# Patient Record
Sex: Male | Born: 1970 | Race: Black or African American | Hispanic: No | Smoking: Never smoker
Health system: Southern US, Community
[De-identification: ages and names within clinical notes are randomized; demographics above are authoritative.]

## PROBLEM LIST (undated history)

## (undated) DIAGNOSIS — E1140 Type 2 diabetes mellitus with diabetic neuropathy, unspecified: Secondary | ICD-10-CM

## (undated) DIAGNOSIS — Z2821 Immunization not carried out because of patient refusal: Secondary | ICD-10-CM

## (undated) DIAGNOSIS — E785 Hyperlipidemia, unspecified: Secondary | ICD-10-CM

## (undated) DIAGNOSIS — J9811 Atelectasis: Secondary | ICD-10-CM

## (undated) DIAGNOSIS — Z9229 Personal history of other drug therapy: Secondary | ICD-10-CM

## (undated) DIAGNOSIS — I10 Essential (primary) hypertension: Secondary | ICD-10-CM

## (undated) DIAGNOSIS — R809 Proteinuria, unspecified: Secondary | ICD-10-CM

## (undated) DIAGNOSIS — N529 Male erectile dysfunction, unspecified: Secondary | ICD-10-CM

## (undated) HISTORY — PX: HERNIA REPAIR: SHX51

## (undated) HISTORY — DX: Personal history of other drug therapy: Z92.29

## (undated) HISTORY — PX: CHOLECYSTECTOMY: SHX55

## (undated) HISTORY — DX: Type 2 diabetes mellitus with diabetic neuropathy, unspecified: E11.40

## (undated) HISTORY — DX: Hyperlipidemia, unspecified: E78.5

## (undated) HISTORY — DX: Proteinuria, unspecified: R80.9

## (undated) HISTORY — DX: Male erectile dysfunction, unspecified: N52.9

## (undated) HISTORY — DX: Atelectasis: J98.11

## (undated) HISTORY — DX: Essential (primary) hypertension: I10

## (undated) HISTORY — PX: WISDOM TOOTH EXTRACTION: SHX21

## (undated) HISTORY — DX: Immunization not carried out because of patient refusal: Z28.21

---

## 2005-08-19 ENCOUNTER — Encounter: Admission: RE | Admit: 2005-08-19 | Discharge: 2005-08-19 | Payer: Self-pay | Admitting: Unknown Physician Specialty

## 2006-05-15 ENCOUNTER — Emergency Department (HOSPITAL_COMMUNITY): Admission: EM | Admit: 2006-05-15 | Discharge: 2006-05-15 | Payer: Self-pay | Admitting: Emergency Medicine

## 2006-11-11 ENCOUNTER — Ambulatory Visit: Payer: Self-pay | Admitting: Family Medicine

## 2006-11-17 ENCOUNTER — Ambulatory Visit: Payer: Self-pay | Admitting: Family Medicine

## 2006-11-20 ENCOUNTER — Ambulatory Visit: Payer: Self-pay | Admitting: Family Medicine

## 2006-11-30 ENCOUNTER — Encounter: Admission: RE | Admit: 2006-11-30 | Discharge: 2006-11-30 | Payer: Self-pay | Admitting: Family Medicine

## 2006-12-21 ENCOUNTER — Ambulatory Visit: Payer: Self-pay | Admitting: Family Medicine

## 2007-01-21 ENCOUNTER — Ambulatory Visit: Payer: Self-pay | Admitting: Family Medicine

## 2007-04-28 ENCOUNTER — Ambulatory Visit: Payer: Self-pay | Admitting: Family Medicine

## 2007-07-26 ENCOUNTER — Ambulatory Visit: Payer: Self-pay | Admitting: Family Medicine

## 2007-11-29 ENCOUNTER — Ambulatory Visit: Payer: Self-pay | Admitting: Family Medicine

## 2007-12-15 ENCOUNTER — Ambulatory Visit: Payer: Self-pay | Admitting: Family Medicine

## 2007-12-18 ENCOUNTER — Emergency Department (HOSPITAL_COMMUNITY): Admission: EM | Admit: 2007-12-18 | Discharge: 2007-12-18 | Payer: Self-pay | Admitting: Advanced Practice Midwife

## 2009-02-01 ENCOUNTER — Ambulatory Visit: Payer: Self-pay | Admitting: Family Medicine

## 2009-02-15 ENCOUNTER — Ambulatory Visit: Payer: Self-pay | Admitting: Family Medicine

## 2009-05-17 ENCOUNTER — Ambulatory Visit: Payer: Self-pay | Admitting: Family Medicine

## 2009-09-06 ENCOUNTER — Ambulatory Visit: Payer: Self-pay | Admitting: Family Medicine

## 2009-12-05 ENCOUNTER — Ambulatory Visit: Payer: Self-pay | Admitting: Family Medicine

## 2010-04-04 ENCOUNTER — Ambulatory Visit: Payer: Self-pay | Admitting: Family Medicine

## 2010-04-16 ENCOUNTER — Ambulatory Visit (INDEPENDENT_AMBULATORY_CARE_PROVIDER_SITE_OTHER): Payer: BC Managed Care – PPO | Admitting: Family Medicine

## 2010-04-16 DIAGNOSIS — I10 Essential (primary) hypertension: Secondary | ICD-10-CM

## 2010-04-16 DIAGNOSIS — E785 Hyperlipidemia, unspecified: Secondary | ICD-10-CM

## 2010-04-16 DIAGNOSIS — Z79899 Other long term (current) drug therapy: Secondary | ICD-10-CM

## 2010-04-16 DIAGNOSIS — R040 Epistaxis: Secondary | ICD-10-CM

## 2010-04-16 DIAGNOSIS — E119 Type 2 diabetes mellitus without complications: Secondary | ICD-10-CM

## 2010-05-30 ENCOUNTER — Telehealth: Payer: Self-pay | Admitting: Family Medicine

## 2010-05-30 MED ORDER — VARDENAFIL HCL 20 MG PO TABS: 20.0000 mg | ORAL_TABLET | ORAL | Status: DC | PRN

## 2010-05-30 NOTE — Telephone Encounter (Signed)
Meds renewed.

## 2010-07-10 ENCOUNTER — Encounter: Payer: Self-pay | Admitting: Family Medicine

## 2010-08-14 ENCOUNTER — Encounter: Payer: Self-pay | Admitting: Family Medicine

## 2010-08-16 ENCOUNTER — Ambulatory Visit: Payer: BC Managed Care – PPO | Admitting: Family Medicine

## 2010-09-09 ENCOUNTER — Other Ambulatory Visit: Payer: Self-pay

## 2010-09-09 MED ORDER — PIOGLITAZONE HCL-METFORMIN HCL 15-500 MG PO TABS: 1.0000 | ORAL_TABLET | Freq: Two times a day (BID) | ORAL | Status: DC

## 2010-09-09 NOTE — Telephone Encounter (Signed)
Dr.Lalonde ok the change in diabetes med

## 2011-02-24 ENCOUNTER — Ambulatory Visit (INDEPENDENT_AMBULATORY_CARE_PROVIDER_SITE_OTHER): Payer: BC Managed Care – PPO | Admitting: Medical

## 2011-02-24 ENCOUNTER — Encounter: Payer: Self-pay | Admitting: Medical

## 2011-02-24 VITALS — BP 140/90 | HR 80 | Temp 98.1°F | Ht 68.5 in | Wt 206.0 lb

## 2011-02-24 DIAGNOSIS — E1140 Type 2 diabetes mellitus with diabetic neuropathy, unspecified: Secondary | ICD-10-CM

## 2011-02-24 DIAGNOSIS — I10 Essential (primary) hypertension: Secondary | ICD-10-CM

## 2011-02-24 DIAGNOSIS — E785 Hyperlipidemia, unspecified: Secondary | ICD-10-CM

## 2011-02-24 DIAGNOSIS — B353 Tinea pedis: Secondary | ICD-10-CM

## 2011-02-24 DIAGNOSIS — Z0000 Encounter for general adult medical examination without abnormal findings: Secondary | ICD-10-CM

## 2011-02-24 DIAGNOSIS — E1149 Type 2 diabetes mellitus with other diabetic neurological complication: Secondary | ICD-10-CM

## 2011-02-24 DIAGNOSIS — E1142 Type 2 diabetes mellitus with diabetic polyneuropathy: Secondary | ICD-10-CM

## 2011-02-24 DIAGNOSIS — Z125 Encounter for screening for malignant neoplasm of prostate: Secondary | ICD-10-CM

## 2011-02-24 DIAGNOSIS — A630 Anogenital (venereal) warts: Secondary | ICD-10-CM

## 2011-02-24 LAB — CBC WITH DIFFERENTIAL/PLATELET
Basophils Absolute: 0 10*3/uL (ref 0.0–0.1)
Lymphocytes Relative: 33 % (ref 12–46)
Lymphs Abs: 1.5 10*3/uL (ref 0.7–4.0)
Neutrophils Relative %: 56 % (ref 43–77)
Platelets: 210 10*3/uL (ref 150–400)
RBC: 4.66 MIL/uL (ref 4.22–5.81)
WBC: 4.6 10*3/uL (ref 4.0–10.5)

## 2011-02-24 LAB — VITAMIN B12: Vitamin B-12: 584 pg/mL (ref 211–911)

## 2011-02-24 MED ORDER — ECONAZOLE NITRATE 1 % EX CREA: TOPICAL_CREAM | Freq: Every day | CUTANEOUS | Status: AC

## 2011-02-24 NOTE — Progress Notes (Signed)
Subjective:   HPI  Bruce Mora is a 41 y.o. male who presents for a complete physical.    Preventative care - he has scheduled an upcoming eye exam, and plans to schedule upcoming dental exam given some broken teeth.    Diabetes - checks glucose regularly, typically glucose ranges 75-145.  Checks feet daily.  He does have hx/o diabetic complications - retinopathy, neuropathy.  Since last visit here about a year ago, he really started working on diet and exercise.  Goes to gym regularly.  He needs 90 day refill on medications.  BPs at work 135-150/85-90s.    Current issues: rash of forearms, resolving with gold bond powder.  Broken molars.  Vision changes mainly blurred vision at night time.  Had cold recently and cough is still lingering some.  Gets tingling in feet bilat at night.    Reviewed their medical, surgical, family, social, medication, and allergy history and updated chart as appropriate.   Review of Systems Constitutional: -fever, -chills, -sweats, -unexpected weight change, -anorexia, -fatigue Allergy: -sneezing, +itching, -congestion Dermatology: + changing moles, rash, lumps, new worrisome lesions ENT: -runny nose, -ear pain, -sore throat, -hoarseness, -sinus pain, -teeth pain, -tinnitus, -hearing loss, -epistaxis Cardiology:  -chest pain, -palpitations, -edema, -orthopnea, -paroxysmal nocturnal dyspnea Respiratory: +cough, -shortness of breath, -dyspnea on exertion, -wheezing, -hemoptysis Gastroenterology: -abdominal pain, -nausea, -vomiting, -diarrhea, -constipation, -blood in stool, -changes in bowel movement, -dysphagia Hematology: -bleeding or+ bruising problems Musculoskeletal: -arthralgias, -myalgias, -joint swelling, -back pain, -neck pain, -cramping, -gait changes Ophthalmology: -vision changes, -eye redness, -itching, -discharge Urology: -dysuria, -difficulty urinating, -hematuria, -urinary frequency, -urgency, incontinence Neurology: -headache, -weakness,  +tingling, -numbness, -speech abnormality, -memory loss, -falls, -dizziness Psychology:  -depressed mood, -agitation, -sleep problems  Allergies  Allergen Reactions  . Asa Buff (Mag (Buffered Aspirin)     GI upset  . Glucophage (Metformin Hydrochloride)     Rash?  . Ibuprofen (Advil)   . Lipitor (Atorvastatin Calcium)     Myalgias and arthralgias  . Prilosec (Omeprazole Magnesium)     GI upset    Current Outpatient Prescriptions on File Prior to Visit  Medication Sig Dispense Refill  . lisinopril (PRINIVIL,ZESTRIL) 40 MG tablet Take 40 mg by mouth daily.        . pioglitazone-metformin (ACTOPLUS MET) 15-500 MG per tablet Take 1 tablet by mouth 2 (two) times daily with a meal.  180 tablet  1  . simvastatin (ZOCOR) 80 MG tablet Take 80 mg by mouth at bedtime.        . vardenafil (LEVITRA) 20 MG tablet Take 1 tablet (20 mg total) by mouth as needed for erectile dysfunction.  6 tablet  12    Past Medical History  Diagnosis Date  . Hypertension   . ED (erectile dysfunction)   . Diabetes mellitus   . Dyslipidemia   . Atelectasis     chorine gas exposure, hospitalization  . Diabetic neuropathy   . Diabetic retinopathy     laser surgery - 2010  . Microalbuminuria      Past Surgical History  Procedure Date  . Cholecystectomy   . Wisdom tooth extraction   . Hernia repair     umbilical x 2    Family History  Problem Relation Age of Onset  . Diabetes Mother   . Cancer Mother     breast  . Diabetes Father   . Cancer Maternal Grandmother     died of lung cancer, hx/o breast cancer  . Stroke Neg Hx   .  Heart disease Neg Hx   . Hypertension Neg Hx     History   Social History  . Marital Status: Divorced    Spouse Name: N/A    Number of Children: N/A  . Years of Education: N/A   Occupational History  . Technical brewer   Social History Main Topics  . Smoking status: Never Smoker   . Smokeless tobacco: Not on file  . Alcohol Use: 7.2 oz/week    12  Cans of beer per week  . Drug Use: No  . Sexually Active: Not on file   Other Topics Concern  . Not on file   Social History Narrative   Single, living with girlfriend, 2 daughters, exercise 3 days per week     Objective:   Physical Exam  Filed Vitals:   02/24/11 0914  BP: 140/90  Pulse: 80  Temp: 98.1 F (36.7 C)    General appearance: alert, no distress, WD/WN, black male Skin: small patch of somewhat rough brown nonspecific rash on bilat forearms suggestive of atopic dermatitis, otherwise no worrisome lesions HEENT: normocephalic, conjunctiva/corneas normal, sclerae anicteric, PERRLA, EOMi, nares patent, no discharge or erythema, pharynx normal Oral cavity: MMM, tongue normal, posterior upper molar on the left with fracture and decay Neck: supple, no lymphadenopathy, no thyromegaly, no masses, normal ROM, no bruits Chest: non tender, normal shape and expansion Heart: RRR, normal S1, S2, no murmurs Lungs: CTA bilaterally, no wheezes, rhonchi, or rales Abdomen: +bs, soft, non tender, non distended, no masses, no hepatomegaly, no splenomegaly, no bruits Back: non tender, normal ROM, no scoliosis Musculoskeletal: upper extremities non tender, no obvious deformity, normal ROM throughout, lower extremities non tender, no obvious deformity, normal ROM throughout Extremities: no edema, no cyanosis, no clubbing Pulses: 2+ symmetric, upper and lower extremities, normal cap refill Neurological: alert, oriented x 3, CN2-12 intact, strength normal upper extremities and lower extremities, sensation normal throughout, DTRs 2+ throughout, no cerebellar signs, gait normal, foot sensation with monofilament seems relatively normal though Psychiatric: normal affect, behavior normal, pleasant  GU: right side of penis shaft with 3mm raised verrucal lesion, otherwise normal male external genitalia, nontender, no masses, no hernia, no lymphadenopathy Rectal: anus normal appearing, prostate smooth  without nodules, no obvious hemorrhoids   Assessment and Plan :    Encounter Diagnoses  Name Primary?  . General medical examination Yes  . Type II or unspecified type diabetes mellitus without mention of complication, uncontrolled   . Essential hypertension, benign   . Hyperlipidemia   . Screening for prostate cancer   . Diabetic neuropathy   . Tinea pedis   . Genital warts      Physical exam - discussed healthy lifestyle, diet, exercise, preventative care, vaccinations, and addressed their concerns.  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  Discussed goals of diabetic care, labs today, and will call with results and plan.  HTN - recommended adding on medication to his regimen today based on his recent BP self report.  He declines, so we will have him come by in 7mo for nurse visit BP check.  If still elevated, then will add medication.  Hyperlipidemia - labs today.  PSA screening today.  Tinea pedis - begin econazole cream, stay out of the shower at work barefooted.  Recheck in 4-6 wk.  Genital wart - discussed options for treatment.  Dr. Susann Givens supervising physician performed procedure.  Cleaned and prepped area in usual sterile fashion.  Used 1% lidocaine without epi for  local anesthesia of right shaft of penis.  Removed 1 verruca lesion with electrocautery.  Pt tolerated procedure well.    Follow-up pending labs.

## 2011-02-24 NOTE — Patient Instructions (Signed)
See your dentist and eye doctor soon.  Continue to eat healthy, but exercise more.    Have nurse check your blood pressure at work and bring these in and return in 3-4 weeks for nurse visit BP check.  We will call with lab results and plan.    Preventative Care for Adults, Male       REGULAR HEALTH EXAMS:  A routine yearly physical is a good way to check in with your primary care provider about your health and preventive screening. It is also an opportunity to share updates about your health and any concerns you have, and receive a thorough all-over exam.   Most health insurance companies pay for at least some preventative services.  Check with your health plan for specific coverages.  WHAT PREVENTATIVE SERVICES DO MEN NEED?  Adult men should have their weight and blood pressure checked regularly.   Men age 89 and older should have their cholesterol levels checked regularly.  Beginning at age 10 and continuing to age 21, men should be screened for colorectal cancer.  Certain people should may need continued testing until age 53.  Other cancer screening may include exams for testicular and prostate cancer.  Updating vaccinations is part of preventative care.  Vaccinations help protect against diseases such as the flu.  Lab tests are generally done as part of preventative care to screen for anemia and blood disorders, to screen for problems with the kidneys and liver, to screen for bladder problems, to check blood sugar, and to check your cholesterol level.  Preventative services generally include counseling about diet, exercise, avoiding tobacco, drugs, excessive alcohol consumption, and sexually transmitted infections.    GENERAL RECOMMENDATIONS FOR GOOD HEALTH:  Healthy diet:  Eat a variety of foods, including fruit, vegetables, animal or vegetable protein, such as meat, fish, chicken, and eggs, or beans, lentils, tofu, and grains, such as rice.  Drink plenty of water  daily.  Decrease saturated fat in the diet, avoid lots of red meat, processed foods, sweets, fast foods, and fried foods.  Exercise:  Aerobic exercise helps maintain good heart health. At least 30-40 minutes of moderate-intensity exercise is recommended. For example, a brisk walk that increases your heart rate and breathing. This should be done on most days of the week.   Find a type of exercise or a variety of exercises that you enjoy so that it becomes a part of your daily life.  Examples are running, walking, swimming, water aerobics, and biking.  For motivation and support, explore group exercise such as aerobic class, spin class, Zumba, Yoga,or  martial arts, etc.    Set exercise goals for yourself, such as a certain weight goal, walk or run in a race such as a 5k walk/run.  Speak to your primary care provider about exercise goals.  Disease prevention:  If you smoke or chew tobacco, find out from your caregiver how to quit. It can literally save your life, no matter how long you have been a tobacco user. If you do not use tobacco, never begin.   Maintain a healthy diet and normal weight. Increased weight leads to problems with blood pressure and diabetes.   The Body Mass Index or BMI is a way of measuring how much of your body is fat. Having a BMI above 27 increases the risk of heart disease, diabetes, hypertension, stroke and other problems related to obesity. Your caregiver can help determine your BMI and based on it develop an exercise and dietary  program to help you achieve or maintain this important measurement at a healthful level.  High blood pressure causes heart and blood vessel problems.  Persistent high blood pressure should be treated with medicine if weight loss and exercise do not work.   Fat and cholesterol leaves deposits in your arteries that can block them. This causes heart disease and vessel disease elsewhere in your body.  If your cholesterol is found to be high, or if  you have heart disease or certain other medical conditions, then you may need to have your cholesterol monitored frequently and be treated with medication.   Ask if you should have a stress test if your history suggests this. A stress test is a test done on a treadmill that looks for heart disease. This test can find disease prior to there being a problem.  Avoid drinking alcohol in excess (more than two drinks per day).  Avoid use of street drugs. Do not share needles with anyone. Ask for professional help if you need assistance or instructions on stopping the use of alcohol, cigarettes, and/or drugs.  Brush your teeth twice a day with fluoride toothpaste, and floss once a day. Good oral hygiene prevents tooth decay and gum disease. The problems can be painful, unattractive, and can cause other health problems. Visit your dentist for a routine oral and dental check up and preventive care every 6-12 months.   Look at your skin regularly.  Use a mirror to look at your back. Notify your caregivers of changes in moles, especially if there are changes in shapes, colors, a size larger than a pencil eraser, an irregular border, or development of new moles.  Safety:  Use seatbelts 100% of the time, whether driving or as a passenger.  Use safety devices such as hearing protection if you work in environments with loud noise or significant background noise.  Use safety glasses when doing any work that could send debris in to the eyes.  Use a helmet if you ride a bike or motorcycle.  Use appropriate safety gear for contact sports.  Talk to your caregiver about gun safety.  Use sunscreen with a SPF (or skin protection factor) of 15 or greater.  Lighter skinned people are at a greater risk of skin cancer. Don't forget to also wear sunglasses in order to protect your eyes from too much damaging sunlight. Damaging sunlight can accelerate cataract formation.   Practice safe sex. Use condoms. Condoms are used for  birth control and to help reduce the spread of sexually transmitted infections (or STIs).  Some of the STIs are gonorrhea (the clap), chlamydia, syphilis, trichomonas, herpes, HPV (human papilloma virus) and HIV (human immunodeficiency virus) which causes AIDS. The herpes, HIV and HPV are viral illnesses that have no cure. These can result in disability, cancer and death.   Keep carbon monoxide and smoke detectors in your home functioning at all times. Change the batteries every 6 months or use a model that plugs into the wall.   Vaccinations:  Stay up to date with your tetanus shots and other required immunizations. You should have a booster for tetanus every 10 years. Be sure to get your flu shot every year, since 5%-20% of the [place] population comes down with the flu. The flu vaccine changes each year, so being vaccinated once is not enough. Get your shot in the fall, before the flu season peaks.   Other vaccines to consider:  Pneumococcal vaccine to protect against certain types of pneumonia.  This is normally recommended for adults age 43 or older.  However, adults younger than 43 years old with certain underlying conditions such as diabetes, heart or lung disease should also receive the vaccine.  Shingles vaccine to protect against Varicella Zoster if you are older than age 42, or younger than 43 years old with certain underlying illness.  Hepatitis A vaccine to protect against a form of infection of the liver by a virus acquired from food.  Hepatitis B vaccine to protect against a form of infection of the liver by a virus acquired from blood or body fluids, particularly if you work in health care.  If you plan to travel internationally, check with your local health department for specific vaccination recommendations.  Cancer Screening:  Most routine colon cancer screening begins at the age of 53. On a yearly basis, doctors may provide special easy to use take-home tests to check for hidden  blood in the stool. Sigmoidoscopy or colonoscopy can detect the earliest forms of colon cancer and is life saving. These tests use a small camera at the end of a tube to directly examine the colon. Speak to your caregiver about this at age 84, when routine screening begins (and is repeated every 5 years unless early forms of pre-cancerous polyps or small growths are found).   At the age of 35 men usually start screening for prostate cancer every year. Screening may begin at a younger age for those with higher risk. Those at higher risk include African-Americans or having a family history of prostate cancer. There are two types of tests for prostate cancer:   Prostate-specific antigen (PSA) testing. Recent studies raise questions about prostate cancer using PSA and you should discuss this with your caregiver.   Digital rectal exam (in which your doctor's lubricated and gloved finger feels for enlargement of the prostate through the anus).   Screening for testicular cancer.  Do a monthly exam of your testicles. Gently roll each testicle between your thumb and fingers, feeling for any abnormal lumps. The best time to do this is after a hot shower or bath when the tissues are looser. Notify your caregivers of any lumps, tenderness or changes in size or shape immediately.

## 2011-02-25 ENCOUNTER — Encounter: Payer: Self-pay | Admitting: Medical

## 2011-02-25 ENCOUNTER — Other Ambulatory Visit: Payer: Self-pay | Admitting: Medical

## 2011-02-25 DIAGNOSIS — I10 Essential (primary) hypertension: Secondary | ICD-10-CM | POA: Insufficient documentation

## 2011-02-25 DIAGNOSIS — Z125 Encounter for screening for malignant neoplasm of prostate: Secondary | ICD-10-CM | POA: Insufficient documentation

## 2011-02-25 DIAGNOSIS — E1140 Type 2 diabetes mellitus with diabetic neuropathy, unspecified: Secondary | ICD-10-CM | POA: Insufficient documentation

## 2011-02-25 DIAGNOSIS — E785 Hyperlipidemia, unspecified: Secondary | ICD-10-CM | POA: Insufficient documentation

## 2011-02-25 DIAGNOSIS — A630 Anogenital (venereal) warts: Secondary | ICD-10-CM | POA: Insufficient documentation

## 2011-02-25 DIAGNOSIS — B353 Tinea pedis: Secondary | ICD-10-CM | POA: Insufficient documentation

## 2011-02-25 DIAGNOSIS — Z0000 Encounter for general adult medical examination without abnormal findings: Secondary | ICD-10-CM | POA: Insufficient documentation

## 2011-02-25 LAB — HEMOGLOBIN A1C: Hgb A1c MFr Bld: 9.4 % — ABNORMAL HIGH (ref <5.7)

## 2011-02-25 LAB — COMPREHENSIVE METABOLIC PANEL
Albumin: 4.6 g/dL (ref 3.5–5.2)
CO2: 29 mEq/L (ref 19–32)
Calcium: 10 mg/dL (ref 8.4–10.5)
Chloride: 101 mEq/L (ref 96–112)
Glucose, Bld: 118 mg/dL — ABNORMAL HIGH (ref 70–99)
Potassium: 4.4 mEq/L (ref 3.5–5.3)
Sodium: 138 mEq/L (ref 135–145)
Total Protein: 7.2 g/dL (ref 6.0–8.3)

## 2011-02-25 LAB — LIPID PANEL
Cholesterol: 211 mg/dL — ABNORMAL HIGH (ref 0–200)
HDL: 55 mg/dL (ref >39)
Total CHOL/HDL Ratio: 3.8 Ratio

## 2011-02-25 LAB — PSA: PSA: 0.8 ng/mL (ref <=4.00)

## 2011-02-26 ENCOUNTER — Other Ambulatory Visit: Payer: Self-pay | Admitting: Medical

## 2011-02-26 MED ORDER — INSULIN DETEMIR 100 UNIT/ML ~~LOC~~ SOLN: 10.0000 [IU] | Freq: Every day | SUBCUTANEOUS | Status: DC

## 2011-02-26 MED ORDER — EZETIMIBE-SIMVASTATIN 10-40 MG PO TABS: 1.0000 | ORAL_TABLET | Freq: Every day | ORAL | Status: DC

## 2011-02-26 MED ORDER — PIOGLITAZONE HCL-METFORMIN HCL 15-850 MG PO TABS: 1.0000 | ORAL_TABLET | Freq: Two times a day (BID) | ORAL | Status: DC

## 2011-03-17 ENCOUNTER — Other Ambulatory Visit: Payer: BC Managed Care – PPO

## 2011-04-21 ENCOUNTER — Telehealth: Payer: Self-pay | Admitting: Internal Medicine

## 2011-04-21 MED ORDER — VARDENAFIL HCL 20 MG PO TABS: 20.0000 mg | ORAL_TABLET | ORAL | Status: DC | PRN

## 2011-04-21 NOTE — Telephone Encounter (Signed)
Levitra renewed 

## 2011-09-24 ENCOUNTER — Other Ambulatory Visit: Payer: Self-pay | Admitting: Medical

## 2011-09-24 NOTE — Telephone Encounter (Signed)
Patient needs to follow with a office visit before this medication runs out.

## 2011-09-24 NOTE — Telephone Encounter (Signed)
Refill request for actoplus met 15/850 #60

## 2012-02-16 ENCOUNTER — Telehealth: Payer: Self-pay | Admitting: Medical

## 2012-02-16 NOTE — Telephone Encounter (Signed)
Due for physical 02/2012.  Make appt.  Decline refill unless he specifically needs a refill of this particular medication (levitra).  Its ok to approve #10 of Levitra.

## 2012-02-17 ENCOUNTER — Other Ambulatory Visit: Payer: Self-pay | Admitting: Family Medicine

## 2012-02-17 MED ORDER — VARDENAFIL HCL 20 MG PO TABS: 20.0000 mg | ORAL_TABLET | ORAL | Status: DC | PRN

## 2012-02-17 NOTE — Telephone Encounter (Signed)
Patient is aware that he needs a office visit he has an appointment on 02/24/12 here for physical. I called out  #10 OF LEVITRA to the patients pharmacy per david tysinger PA-C. CLS

## 2012-03-10 ENCOUNTER — Encounter: Payer: BC Managed Care – PPO | Admitting: Medical

## 2012-03-13 DIAGNOSIS — Z2821 Immunization not carried out because of patient refusal: Secondary | ICD-10-CM

## 2012-03-13 HISTORY — DX: Immunization not carried out because of patient refusal: Z28.21

## 2012-04-01 ENCOUNTER — Other Ambulatory Visit: Payer: Self-pay | Admitting: Medical

## 2012-04-06 ENCOUNTER — Encounter: Payer: Self-pay | Admitting: Medical

## 2012-04-06 ENCOUNTER — Telehealth: Payer: Self-pay | Admitting: Family Medicine

## 2012-04-06 ENCOUNTER — Ambulatory Visit (INDEPENDENT_AMBULATORY_CARE_PROVIDER_SITE_OTHER): Payer: BC Managed Care – PPO | Admitting: Medical

## 2012-04-06 VITALS — BP 150/90 | HR 84 | Temp 97.6°F | Resp 16 | Ht 70.0 in | Wt 217.0 lb

## 2012-04-06 DIAGNOSIS — E785 Hyperlipidemia, unspecified: Secondary | ICD-10-CM

## 2012-04-06 DIAGNOSIS — M7989 Other specified soft tissue disorders: Secondary | ICD-10-CM | POA: Insufficient documentation

## 2012-04-06 DIAGNOSIS — Z0000 Encounter for general adult medical examination without abnormal findings: Secondary | ICD-10-CM

## 2012-04-06 DIAGNOSIS — IMO0002 Reserved for concepts with insufficient information to code with codable children: Secondary | ICD-10-CM

## 2012-04-06 DIAGNOSIS — E1165 Type 2 diabetes mellitus with hyperglycemia: Secondary | ICD-10-CM | POA: Insufficient documentation

## 2012-04-06 DIAGNOSIS — N529 Male erectile dysfunction, unspecified: Secondary | ICD-10-CM

## 2012-04-06 DIAGNOSIS — M25562 Pain in left knee: Secondary | ICD-10-CM

## 2012-04-06 DIAGNOSIS — I10 Essential (primary) hypertension: Secondary | ICD-10-CM

## 2012-04-06 DIAGNOSIS — R809 Proteinuria, unspecified: Secondary | ICD-10-CM

## 2012-04-06 DIAGNOSIS — Z136 Encounter for screening for cardiovascular disorders: Secondary | ICD-10-CM

## 2012-04-06 DIAGNOSIS — M25569 Pain in unspecified knee: Secondary | ICD-10-CM

## 2012-04-06 LAB — POCT URINALYSIS DIPSTICK
Bilirubin, UA: NEGATIVE
Glucose, UA: 50
Ketones, UA: NEGATIVE
Spec Grav, UA: 1.02

## 2012-04-06 LAB — CBC WITH DIFFERENTIAL/PLATELET
Basophils Relative: 0 % (ref 0–1)
Hemoglobin: 13.5 g/dL (ref 13.0–17.0)
Lymphs Abs: 1.5 10*3/uL (ref 0.7–4.0)
Monocytes Relative: 10 % (ref 3–12)
Neutro Abs: 3 10*3/uL (ref 1.7–7.7)
Neutrophils Relative %: 60 % (ref 43–77)
Platelets: 212 10*3/uL (ref 150–400)
RBC: 4.58 MIL/uL (ref 4.22–5.81)

## 2012-04-06 LAB — LIPID PANEL: HDL: 49 mg/dL (ref >39)

## 2012-04-06 LAB — COMPREHENSIVE METABOLIC PANEL
Albumin: 4 g/dL (ref 3.5–5.2)
BUN: 19 mg/dL (ref 6–23)
CO2: 27 mEq/L (ref 19–32)
Calcium: 9.3 mg/dL (ref 8.4–10.5)
Chloride: 102 mEq/L (ref 96–112)
Glucose, Bld: 160 mg/dL — ABNORMAL HIGH (ref 70–99)
Potassium: 4.4 mEq/L (ref 3.5–5.3)

## 2012-04-06 NOTE — Telephone Encounter (Signed)
Patient is aware of his appointment to see Dr. Eden Emms on 04/20/12 @ 230 pm at [facility]. CLS [phone]

## 2012-04-06 NOTE — Patient Instructions (Addendum)
Diabetes - need to work on improving diet, strict diabetic diet.   Continue current medications.  Labs today.  Hypertension - continue Lisinopril 40mg  daily.  We are adding Hydrochlorothiazide once daily in the morning for better blood pressure control.  We are referring you to cardiology for baseline screening.  See eye doctor soon and get diabetic retinopathy screening.  Knee pain - ice, elevation of leg, ACE wrap, Tylenol.  If not improving in 1 week, then we'll need to check an xray.

## 2012-04-06 NOTE — Telephone Encounter (Signed)
Message copied by Janeice Robinson on Tue Apr 06, 2012  3:57 PM ------      Message from: Jac Canavan      Created: Tue Apr 06, 2012 10:42 AM       Refer to cardiology for screening, baseline cardiac eval, hx/o HTN, uncontrolled diabetes, ED. ------
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
## 2012-04-07 LAB — VITAMIN D 25 HYDROXY (VIT D DEFICIENCY, FRACTURES): Vit D, 25-Hydroxy: 18 ng/mL — ABNORMAL LOW (ref 30–89)

## 2012-04-07 LAB — D-DIMER, QUANTITATIVE: D-Dimer, Quant: 0.44 ug/mL-FEU (ref 0.00–0.48)

## 2012-04-07 LAB — HIGH SENSITIVITY CRP: CRP, High Sensitivity: 1.1 mg/L

## 2012-04-07 LAB — MICROALBUMIN / CREATININE URINE RATIO
Microalb Creat Ratio: 133.8 mg/g — ABNORMAL HIGH (ref 0.0–30.0)
Microalb, Ur: 24.11 mg/dL — ABNORMAL HIGH (ref 0.00–1.89)

## 2012-04-09 ENCOUNTER — Other Ambulatory Visit: Payer: Self-pay | Admitting: Medical

## 2012-04-09 MED ORDER — INSULIN DETEMIR 100 UNIT/ML ~~LOC~~ SOLN: 30.0000 [IU] | Freq: Every day | SUBCUTANEOUS | Status: DC

## 2012-04-09 MED ORDER — INSULIN DETEMIR 100 UNIT/ML ~~LOC~~ SOLN: 20.0000 [IU] | Freq: Every day | SUBCUTANEOUS | Status: DC

## 2012-04-09 MED ORDER — EZETIMIBE-SIMVASTATIN 10-40 MG PO TABS: 1.0000 | ORAL_TABLET | Freq: Every day | ORAL | Status: DC

## 2012-04-09 MED ORDER — ALOGLIPTIN BENZOATE 12.5 MG PO TABS: 1.0000 | ORAL_TABLET | Freq: Every day | ORAL | Status: DC

## 2012-04-20 ENCOUNTER — Encounter: Payer: Self-pay | Admitting: Cardiovascular Disease

## 2012-04-20 ENCOUNTER — Ambulatory Visit (INDEPENDENT_AMBULATORY_CARE_PROVIDER_SITE_OTHER): Payer: BC Managed Care – PPO | Admitting: Cardiovascular Disease

## 2012-04-20 VITALS — BP 144/98 | HR 88 | Ht 70.0 in | Wt 210.8 lb

## 2012-04-20 DIAGNOSIS — R0609 Other forms of dyspnea: Secondary | ICD-10-CM

## 2012-04-20 DIAGNOSIS — I10 Essential (primary) hypertension: Secondary | ICD-10-CM

## 2012-04-20 DIAGNOSIS — R0600 Dyspnea, unspecified: Secondary | ICD-10-CM | POA: Insufficient documentation

## 2012-04-20 DIAGNOSIS — E785 Hyperlipidemia, unspecified: Secondary | ICD-10-CM

## 2012-04-20 DIAGNOSIS — R0989 Other specified symptoms and signs involving the circulatory and respiratory systems: Secondary | ICD-10-CM

## 2012-04-20 NOTE — Assessment & Plan Note (Signed)
Cholesterol is at goal.  Continue current dose of statin and diet Rx.  No myalgias or side effects.  F/U  LFT's in 6 months. Lab Results  Component Value Date   LDLCALC 139* 04/06/2012

## 2012-04-20 NOTE — Assessment & Plan Note (Signed)
Left knee pain probably orthopedic but has decreased pulses in feet and right popliteal F/U ABI's

## 2012-04-20 NOTE — Assessment & Plan Note (Signed)
R/O anginal equivalent in longstanding IDDM with elevated lipids and HTN  F/U ETT

## 2012-04-20 NOTE — Assessment & Plan Note (Signed)
Well controlled.  Continue current medications and low sodium Dash type diet.    

## 2012-04-20 NOTE — Patient Instructions (Signed)
Your physician recommends that you schedule a follow-up appointment in:  AS NEEDED Your physician has requested that you have an exercise tolerance test. For further information please visit [email or web address] Please also follow instruction sheet, as given.   Your physician has requested that you have an ankle brachial index (ABI). During this test an ultrasound and blood pressure cuff are used to evaluate the arteries that supply the arms and legs with blood. Allow thirty minutes for this exam. There are no restrictions or special instructions.

## 2012-04-20 NOTE — Progress Notes (Signed)
Patient ID: Bruce Mora, male   DOB: [birth year], 42 y.o.   MRN: [id number] 42 y.o. IDDM referred by primary for ? Vascular disease.  Has some ED but no documented CAD, PVD or carotid disease.  He works very hard at [employer] in [place] and rides a bike over [employer] with 100 lb of tools.  Has ome pain in LLE that he attributes to knee problems. No calf pain. Mild exertional dyspnea but no frank chest pain.  Has been on insulin over 10 years and DM over 25.  Checks BS 2-3x/week Has never had a stress test.  Compliant with meds  Takes levitra for ED indicating small vessel disease.   ROS: Denies fever, malais, weight loss, blurry vision, decreased visual acuity, cough, sputum, SOB, hemoptysis, pleuritic pain, palpitaitons, heartburn, abdominal pain, melena, lower extremity edema, claudication, or rash.  All other systems reviewed and negative   General: Affect appropriate Healthy:  appears stated age HEENT: normal Neck supple with no adenopathy JVP normal no bruits no thyromegaly Lungs clear with no wheezing and good diaphragmatic motion Heart:  S1/S2 no murmur,rub, gallop or click PMI normal Abdomen: benighn, BS positve, no tenderness, no AAA no bruit.  No HSM or HJR Distal pulses intact on left but left popliteal appears diminished and PTDP hard to feel No edema Neuro non-focal Skin warm and dry No muscular weakness  Medications Current Outpatient Prescriptions  Medication Sig Dispense Refill  . ezetimibe-simvastatin (VYTORIN) 10-40 MG per tablet Take 1 tablet by mouth at bedtime.  30 tablet  2  . insulin detemir (LEVEMIR FLEXPEN) 100 UNIT/ML injection Inject 0.3 mLs (30 Units total) into the skin at bedtime.  15 mL  4  . lisinopril (PRINIVIL,ZESTRIL) 40 MG tablet Take 40 mg by mouth daily.        . pioglitazone-metformin (ACTOPLUS MET) 15-850 MG per tablet TAKE ONE TABLET BY MOUTH TWICE DAILY WITH MEALS  60 tablet  4  . PREVIDENT 5000 SENSITIVE 1.1-5 % PSTE       . vardenafil  (LEVITRA) 20 MG tablet Take 1 tablet (20 mg total) by mouth as needed for erectile dysfunction.  10 tablet  0  . Alogliptin Benzoate (NESINA) 12.5 MG TABS Take 1 tablet by mouth daily.  30 tablet  5   No current facility-administered medications for this visit.    Allergies Asa buff (mag; Glucophage; Ibuprofen; Lipitor; and Prilosec  Family History: Family History  Problem Relation Age of Onset  . Diabetes Mother   . Cancer Mother     breast  . Arthritis Mother   . Diabetes Father   . Cancer Maternal Grandmother     died of lung cancer, hx/o breast cancer  . Heart disease Neg Hx   . Stroke Maternal Aunt   . Hypertension Other     throughout both sides of family    Social History: History   Social History  . Marital Status: Divorced    Spouse Name: N/A    Number of Children: N/A  . Years of Education: N/A   Occupational History  . Technical brewer   Social History Main Topics  . Smoking status: Never Smoker   . Smokeless tobacco: Not on file  . Alcohol Use: 7.2 oz/week    12 Cans of beer per week  . Drug Use: No  . Sexually Active: Not on file   Other Topics Concern  . Not on file   Social History Narrative   Single, divorced, living  with girlfriend, 2 daughters, exercise 3 days per week, 30-45 minutes    Electrocardiogram:  SR rate 88 Possible LAE  Assessment and Plan

## 2012-04-30 ENCOUNTER — Encounter (INDEPENDENT_AMBULATORY_CARE_PROVIDER_SITE_OTHER): Payer: BC Managed Care – PPO

## 2012-04-30 DIAGNOSIS — R0989 Other specified symptoms and signs involving the circulatory and respiratory systems: Secondary | ICD-10-CM

## 2012-04-30 DIAGNOSIS — E1159 Type 2 diabetes mellitus with other circulatory complications: Secondary | ICD-10-CM

## 2012-05-14 ENCOUNTER — Ambulatory Visit (INDEPENDENT_AMBULATORY_CARE_PROVIDER_SITE_OTHER): Payer: BC Managed Care – PPO | Admitting: Physician Assistant

## 2012-05-14 DIAGNOSIS — I10 Essential (primary) hypertension: Secondary | ICD-10-CM

## 2012-05-14 NOTE — Procedures (Signed)
Exercise Treadmill Test  Pre-Exercise Testing Evaluation Rhythm: normal sinus  Rate: 86     Test  Exercise Tolerance Test Ordering MD: Charlton Haws, MD  Interpreting MD: Tereso Newcomer PA-C  Unique Test No: 1  Treadmill:  1  Indication for ETT: Htn  Contraindication to ETT: No   Stress Modality: exercise - treadmill  Cardiac Imaging Performed: non   Protocol: standard Bruce - maximal  Max BP:  220/71  Max MPHR (bpm):  177 85% MPR (bpm):  150  MPHR obtained (bpm):  169 % MPHR obtained:  95%  Reached 85% MPHR (min:sec):  8:04 Total Exercise Time (min-sec):  10:03  Workload in METS:  11.9 Borg Scale: 16  Reason ETT Terminated:  patient's desire to stop    ST Segment Analysis At Rest: normal ST segments - no evidence of significant ST depression With Exercise: non-specific ST changes  Other Information Arrhythmia:  No Angina during ETT:  absent (0) Quality of ETT:  diagnostic  ETT Interpretation:  normal - no evidence of ischemia by ST analysis  Comments: Good exercise tolerance. No chest pain. Hypertensive BP response to exercise. There was insignificant up-sloping ST depression.  No significant changes to suggest ischemia.   Recommendations: F/u with Dr. Charlton Haws as directed. Luna Glasgow, PA-C  9:33 AM 05/14/2012

## 2013-04-01 ENCOUNTER — Emergency Department (HOSPITAL_COMMUNITY)
Admission: EM | Admit: 2013-04-01 | Discharge: 2013-04-01 | Disposition: A | Discharge status: Home / Self Care | Payer: BC Managed Care – PPO | Attending: Emergency Medicine | Admitting: Emergency Medicine

## 2013-04-01 ENCOUNTER — Encounter (HOSPITAL_COMMUNITY): Payer: Self-pay | Admitting: Emergency Medicine

## 2013-04-01 ENCOUNTER — Emergency Department (HOSPITAL_COMMUNITY): Payer: BC Managed Care – PPO

## 2013-04-01 DIAGNOSIS — E785 Hyperlipidemia, unspecified: Secondary | ICD-10-CM | POA: Insufficient documentation

## 2013-04-01 DIAGNOSIS — E1139 Type 2 diabetes mellitus with other diabetic ophthalmic complication: Secondary | ICD-10-CM | POA: Insufficient documentation

## 2013-04-01 DIAGNOSIS — E1142 Type 2 diabetes mellitus with diabetic polyneuropathy: Secondary | ICD-10-CM | POA: Insufficient documentation

## 2013-04-01 DIAGNOSIS — Z79899 Other long term (current) drug therapy: Secondary | ICD-10-CM | POA: Insufficient documentation

## 2013-04-01 DIAGNOSIS — I10 Essential (primary) hypertension: Secondary | ICD-10-CM | POA: Insufficient documentation

## 2013-04-01 DIAGNOSIS — R739 Hyperglycemia, unspecified: Secondary | ICD-10-CM

## 2013-04-01 DIAGNOSIS — E11319 Type 2 diabetes mellitus with unspecified diabetic retinopathy without macular edema: Secondary | ICD-10-CM | POA: Insufficient documentation

## 2013-04-01 DIAGNOSIS — Z794 Long term (current) use of insulin: Secondary | ICD-10-CM | POA: Insufficient documentation

## 2013-04-01 DIAGNOSIS — Z87448 Personal history of other diseases of urinary system: Secondary | ICD-10-CM | POA: Insufficient documentation

## 2013-04-01 DIAGNOSIS — J209 Acute bronchitis, unspecified: Secondary | ICD-10-CM | POA: Insufficient documentation

## 2013-04-01 DIAGNOSIS — E1149 Type 2 diabetes mellitus with other diabetic neurological complication: Secondary | ICD-10-CM | POA: Insufficient documentation

## 2013-04-01 DIAGNOSIS — J40 Bronchitis, not specified as acute or chronic: Secondary | ICD-10-CM

## 2013-04-01 LAB — BASIC METABOLIC PANEL
BUN: 10 mg/dL (ref 6–23)
CALCIUM: 9.1 mg/dL (ref 8.4–10.5)
CO2: 30 mEq/L (ref 19–32)
Chloride: 96 mEq/L (ref 96–112)
Creatinine, Ser: 0.71 mg/dL (ref 0.50–1.35)
GFR calc Af Amer: >90 mL/min (ref >90)
GFR calc non Af Amer: >90 mL/min (ref >90)
GLUCOSE: 404 mg/dL — AB (ref 70–99)
POTASSIUM: 4 meq/L (ref 3.7–5.3)
SODIUM: 137 meq/L (ref 137–147)

## 2013-04-01 LAB — CBC WITH DIFFERENTIAL/PLATELET
Basophils Absolute: 0 10*3/uL (ref 0.0–0.1)
Basophils Relative: 0 % (ref 0–1)
Eosinophils Absolute: 0.1 10*3/uL (ref 0.0–0.7)
Eosinophils Relative: 1 % (ref 0–5)
HCT: 39.9 % (ref 39.0–52.0)
HEMOGLOBIN: 13.3 g/dL (ref 13.0–17.0)
LYMPHS ABS: 2 10*3/uL (ref 0.7–4.0)
LYMPHS PCT: 37 % (ref 12–46)
MCH: 29.6 pg (ref 26.0–34.0)
MCHC: 33.3 g/dL (ref 30.0–36.0)
MCV: 88.9 fL (ref 78.0–100.0)
MONOS PCT: 8 % (ref 3–12)
Monocytes Absolute: 0.4 10*3/uL (ref 0.1–1.0)
NEUTROS PCT: 55 % (ref 43–77)
Neutro Abs: 3 10*3/uL (ref 1.7–7.7)
PLATELETS: 181 10*3/uL (ref 150–400)
RBC: 4.49 MIL/uL (ref 4.22–5.81)
RDW: 12.6 % (ref 11.5–15.5)
WBC: 5.6 10*3/uL (ref 4.0–10.5)

## 2013-04-01 LAB — CBG MONITORING, ED
GLUCOSE-CAPILLARY: 298 mg/dL — AB (ref 70–99)
Glucose-Capillary: 385 mg/dL — ABNORMAL HIGH (ref 70–99)

## 2013-04-01 MED ORDER — ALBUTEROL SULFATE HFA 108 (90 BASE) MCG/ACT IN AERS
2.0000 | INHALATION_SPRAY | RESPIRATORY_TRACT | Status: DC | PRN
  Administered 2013-04-01: 2 via RESPIRATORY_TRACT
  Filled 2013-04-01: qty 6.7

## 2013-04-01 MED ORDER — AZITHROMYCIN 250 MG PO TABS: ORAL_TABLET | ORAL | Status: DC

## 2013-04-01 MED ORDER — ALBUTEROL SULFATE (2.5 MG/3ML) 0.083% IN NEBU
5.0000 mg | INHALATION_SOLUTION | Freq: Once | RESPIRATORY_TRACT | Status: AC
  Administered 2013-04-01: 2.5 mg via RESPIRATORY_TRACT
  Filled 2013-04-01: qty 6

## 2013-04-01 MED ORDER — IPRATROPIUM-ALBUTEROL 0.5-2.5 (3) MG/3ML IN SOLN
3.0000 mL | Freq: Once | RESPIRATORY_TRACT | Status: AC
  Administered 2013-04-01: 3 mL via RESPIRATORY_TRACT
  Filled 2013-04-01: qty 3

## 2013-04-01 MED ORDER — SODIUM CHLORIDE 0.9 % IV BOLUS (SEPSIS)
1000.0000 mL | Freq: Once | INTRAVENOUS | Status: AC
  Administered 2013-04-01: 1000 mL via INTRAVENOUS

## 2013-04-01 MED ORDER — BENZONATATE 100 MG PO CAPS
200.0000 mg | ORAL_CAPSULE | Freq: Once | ORAL | Status: AC
  Administered 2013-04-01: 200 mg via ORAL
  Filled 2013-04-01: qty 2

## 2013-04-01 MED ORDER — BENZONATATE 200 MG PO CAPS: 200.0000 mg | ORAL_CAPSULE | Freq: Three times a day (TID) | ORAL | Status: DC | PRN

## 2013-04-01 NOTE — ED Notes (Signed)
RT called to give neb 

## 2013-04-01 NOTE — ED Notes (Signed)
Pt c/o np cough x 3 days.

## 2013-04-01 NOTE — ED Notes (Signed)
Patient transported to X-ray 
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
## 2013-04-01 NOTE — Discharge Instructions (Signed)
Bronchitis Bronchitis is inflammation of the airways that extend from the windpipe into the lungs (bronchi). The inflammation often causes mucus to develop, which leads to a cough. If the inflammation becomes severe, it may cause shortness of breath. CAUSES  Bronchitis may be caused by:   Viral infections.   Bacteria.   Cigarette smoke.   Allergens, pollutants, and other irritants.  SIGNS AND SYMPTOMS  The most common symptom of bronchitis is a frequent cough that produces mucus. Other symptoms include:  Fever.   Body aches.   Chest congestion.   Chills.   Shortness of breath.   Sore throat.  DIAGNOSIS  Bronchitis is usually diagnosed through a medical history and physical exam. Tests, such as chest X-rays, are sometimes done to rule out other conditions.  TREATMENT  You may need to avoid contact with whatever caused the problem (smoking, for example). Medicines are sometimes needed. These may include:  Antibiotics. These may be prescribed if the condition is caused by bacteria.  Cough suppressants. These may be prescribed for relief of cough symptoms.   Inhaled medicines. These may be prescribed to help open your airways and make it easier for you to breathe.   Steroid medicines. These may be prescribed for those with recurrent (chronic) bronchitis. HOME CARE INSTRUCTIONS  Get plenty of rest.   Drink enough fluids to keep your urine clear or pale yellow (unless you have a medical condition that requires fluid restriction). Increasing fluids may help thin your secretions and will prevent dehydration.   Only take over-the-counter or prescription medicines as directed by your health care provider.  Only take antibiotics as directed. Make sure you finish them even if you start to feel better.  Avoid secondhand smoke, irritating chemicals, and strong fumes. These will make bronchitis worse. If you are a smoker, quit smoking. Consider using nicotine gum or  skin patches to help control withdrawal symptoms. Quitting smoking will help your lungs heal faster.   Put a cool-mist humidifier in your bedroom at night to moisten the air. This may help loosen mucus. Change the water in the humidifier daily. You can also run the hot water in your shower and sit in the bathroom with the door closed for 5 10 minutes.   Follow up with your health care provider as directed.   Wash your hands frequently to avoid catching bronchitis again or spreading an infection to others.  SEEK MEDICAL CARE IF: Your symptoms do not improve after 1 week of treatment.  SEEK IMMEDIATE MEDICAL CARE IF:  Your fever increases.  You have chills.   You have chest pain.   You have worsening shortness of breath.   You have bloody sputum.  You faint.  You have lightheadedness.  You have a severe headache.   You vomit repeatedly. MAKE SURE YOU:   Understand these instructions.  Will watch your condition.  Will get help right away if you are not doing well or get worse. Document Released: 12/30/2004 Document Revised: 10/20/2012 Document Reviewed: 08/24/2012 [facility] Patient Information 2014 [place].  High Blood Sugar High blood sugar (hyperglycemia) means that the level of sugar in your blood is higher than it should be. Signs of high blood sugar include:  Feeling thirsty.  Frequent peeing (urinating).  Feeling tired or sleepy.  Dry mouth.  Vision changes.  Feeling weak.  Feeling hungry but losing weight.  Numbness and tingling in your hands or feet.  Headache. When you ignore these signs, your blood sugar may keep  going up. These problems may get worse, and other problems may begin. HOME CARE  Check your blood sugars as told by your doctor. Write down the numbers with the date and time.  Take the right amount of insulin or diabetes pills at the right time. Write down the dose with date and time.  Refill your insulin or diabetes  pills before running out.  Watch what you eat. Follow your meal plan.  Drink liquids without sugar, such as water. Check with your doctor if you have kidney or heart disease.  Follow your doctor's orders for exercise. Exercise at the same time of day.  Keep your doctor's appointments. GET HELP RIGHT AWAY IF:   You have trouble thinking or are confused.  You have fast breathing with fruity smelling breath.  You pass out (faint).  You have 2 to 3 days of high blood sugars and you do not know why.  You have chest pain.  You are feeling sick to your stomach (nauseous) or throwing up (vomiting).  You have sudden vision changes. MAKE SURE YOU:   Understand these instructions.  Will watch your condition.  Will get help right away if you are not doing well or get worse. Document Released: 10/27/2008 Document Revised: 03/24/2011 Document Reviewed: 10/27/2008 [facility] Patient Information 2014 [place], [place].

## 2013-04-06 NOTE — ED Provider Notes (Signed)
Medical screening examination/treatment/procedure(s) were performed by non-physician practitioner and as supervising physician I was immediately available for consultation/collaboration.   EKG Interpretation None        Orpah Greek, MD 04/06/13 [phone]

## 2013-11-23 ENCOUNTER — Other Ambulatory Visit (HOSPITAL_COMMUNITY): Payer: Self-pay | Admitting: Physician Assistant

## 2013-11-23 ENCOUNTER — Ambulatory Visit (HOSPITAL_COMMUNITY)
Admission: RE | Admit: 2013-11-23 | Discharge: 2013-11-23 | Disposition: A | Discharge status: Home / Self Care | Payer: BC Managed Care – PPO | Source: Ambulatory Visit | Attending: Physician Assistant | Admitting: Physician Assistant

## 2013-11-23 ENCOUNTER — Encounter (INDEPENDENT_AMBULATORY_CARE_PROVIDER_SITE_OTHER): Payer: Self-pay

## 2013-11-23 DIAGNOSIS — M25562 Pain in left knee: Secondary | ICD-10-CM

## 2014-02-07 ENCOUNTER — Other Ambulatory Visit (HOSPITAL_COMMUNITY): Payer: Self-pay | Admitting: Orthopaedic Surgery

## 2014-02-07 DIAGNOSIS — M25562 Pain in left knee: Secondary | ICD-10-CM

## 2014-02-13 ENCOUNTER — Ambulatory Visit (HOSPITAL_COMMUNITY)
Admission: RE | Admit: 2014-02-13 | Discharge: 2014-02-13 | Disposition: A | Discharge status: Home / Self Care | Payer: BLUE CROSS/BLUE SHIELD | Source: Ambulatory Visit | Attending: Orthopaedic Surgery | Admitting: Orthopaedic Surgery

## 2014-02-13 ENCOUNTER — Other Ambulatory Visit (HOSPITAL_COMMUNITY): Payer: Self-pay | Admitting: Orthopaedic Surgery

## 2014-02-13 DIAGNOSIS — M25562 Pain in left knee: Secondary | ICD-10-CM | POA: Diagnosis present

## 2014-02-13 DIAGNOSIS — Z01818 Encounter for other preprocedural examination: Secondary | ICD-10-CM

## 2014-02-14 ENCOUNTER — Ambulatory Visit (HOSPITAL_COMMUNITY): Admission: RE | Admit: 2014-02-14 | Payer: BLUE CROSS/BLUE SHIELD | Source: Ambulatory Visit

## 2014-11-07 ENCOUNTER — Ambulatory Visit: Payer: Self-pay | Admitting: "Endocrinology

## 2015-09-03 ENCOUNTER — Other Ambulatory Visit: Payer: Self-pay | Admitting: "Endocrinology

## 2016-05-27 IMAGING — CR DG KNEE COMPLETE 4+V*L*
4 series · 4 of 4 positions shown · non-contrast
Comparison: None.

CLINICAL DATA: Generalized left knee pain without known injury; had
arthrocentesis 1 year ago.

EXAM:
LEFT KNEE - COMPLETE 4+ VIEW

[view not recorded (1 of 4)]
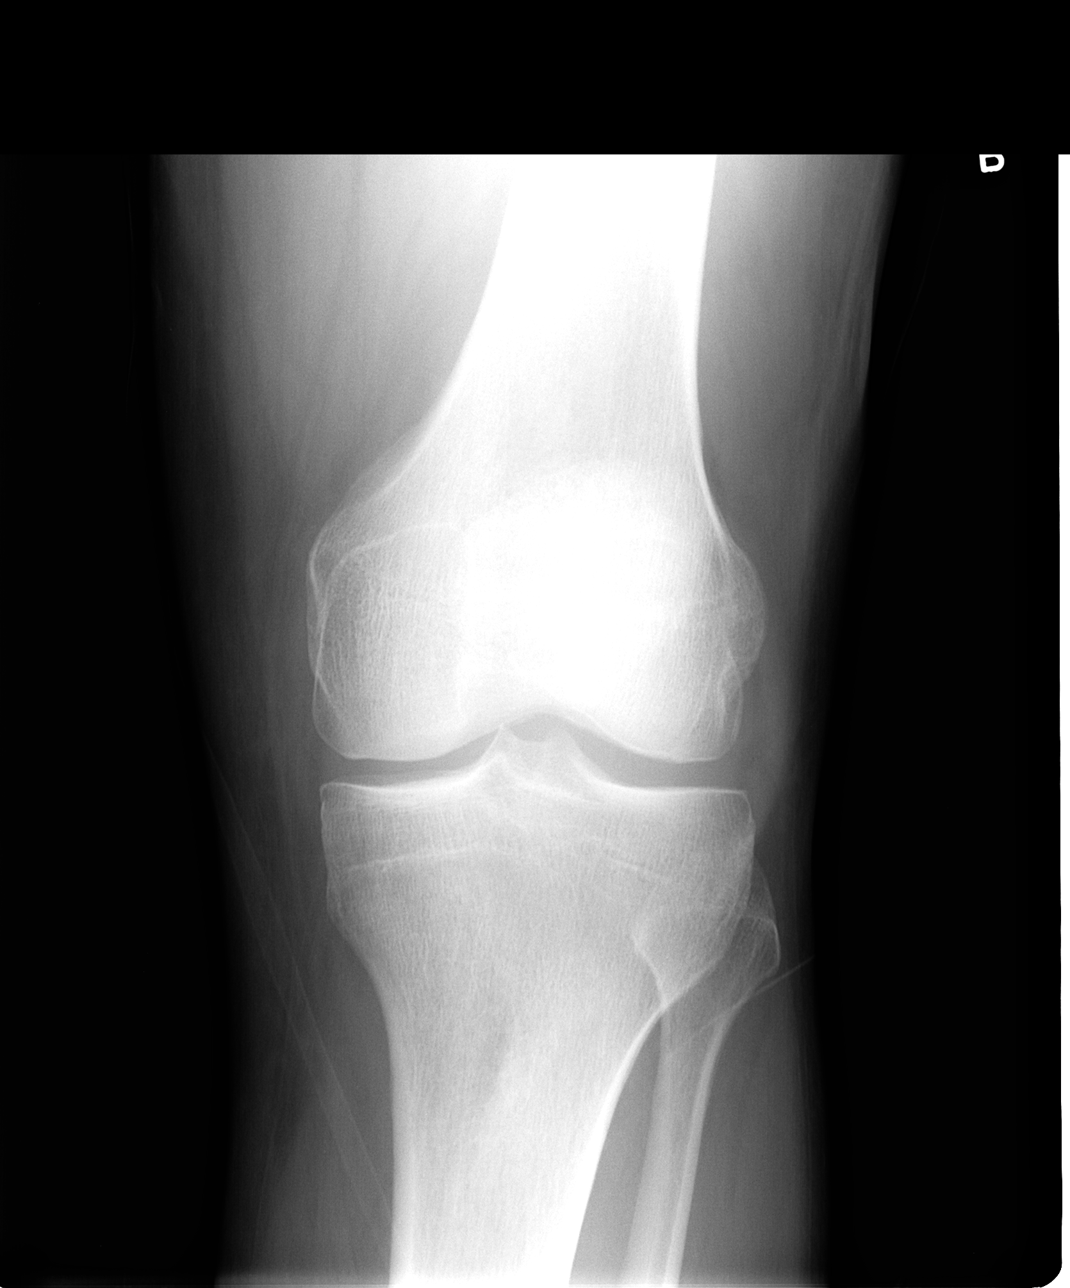

[view not recorded (2 of 4)]
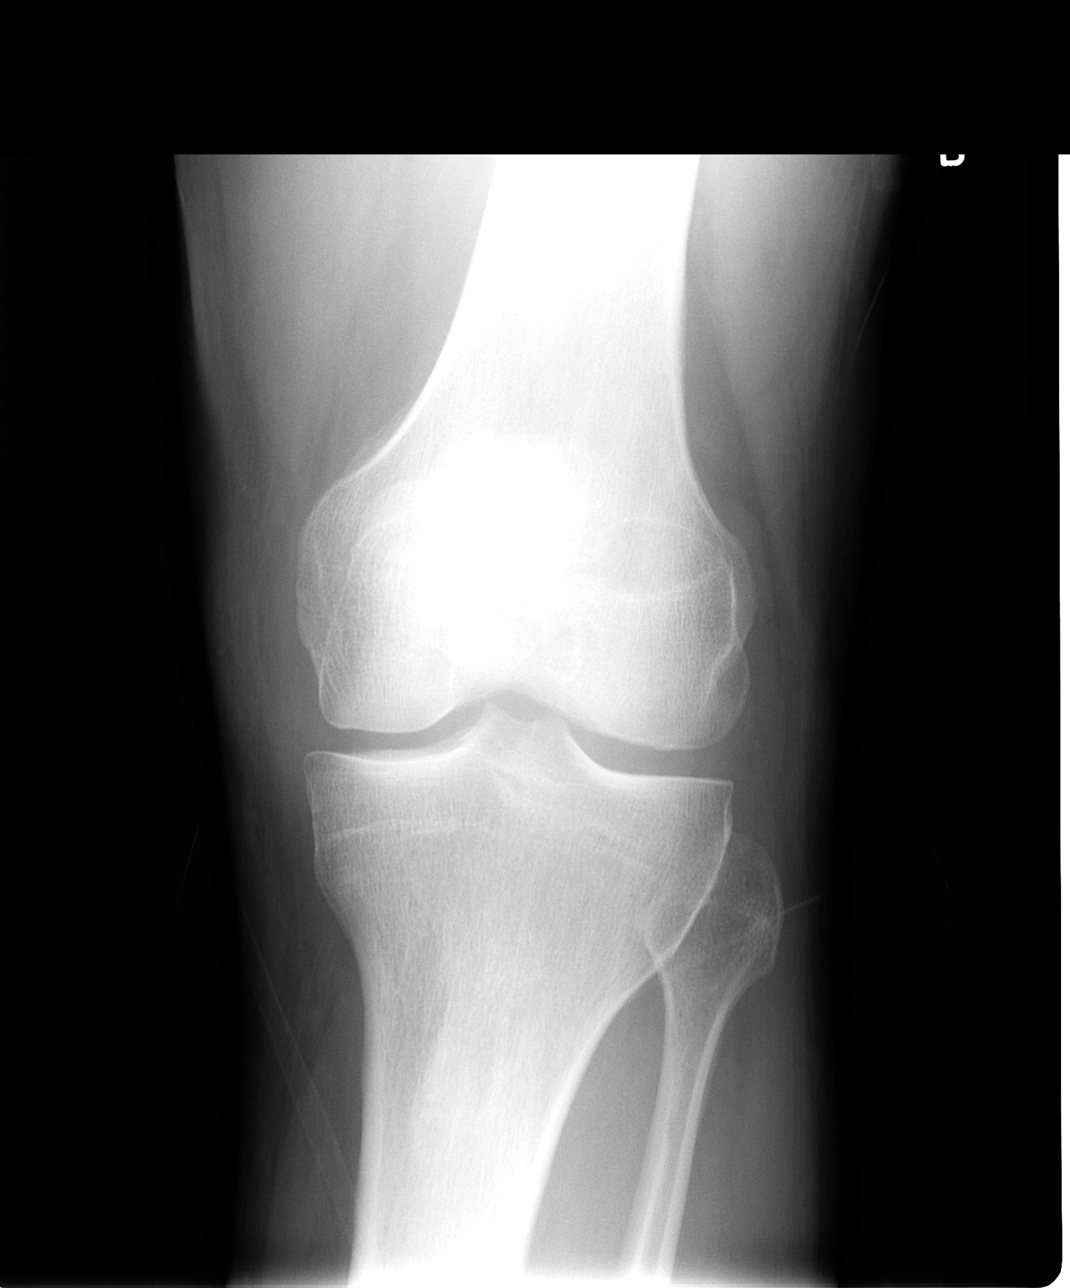

[view not recorded (3 of 4)]
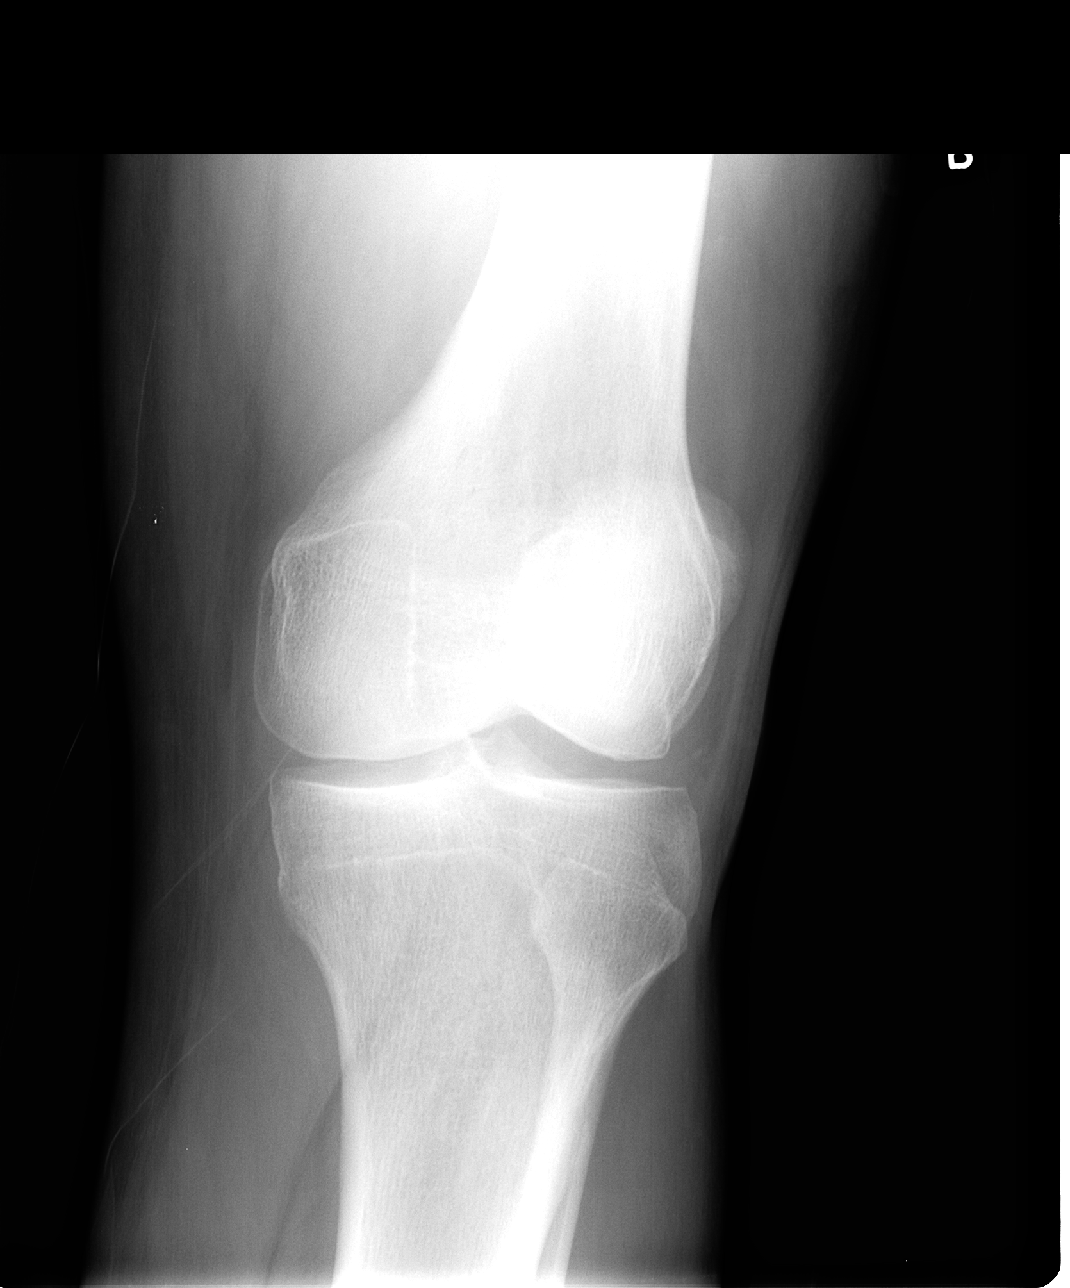

[view not recorded (4 of 4)]
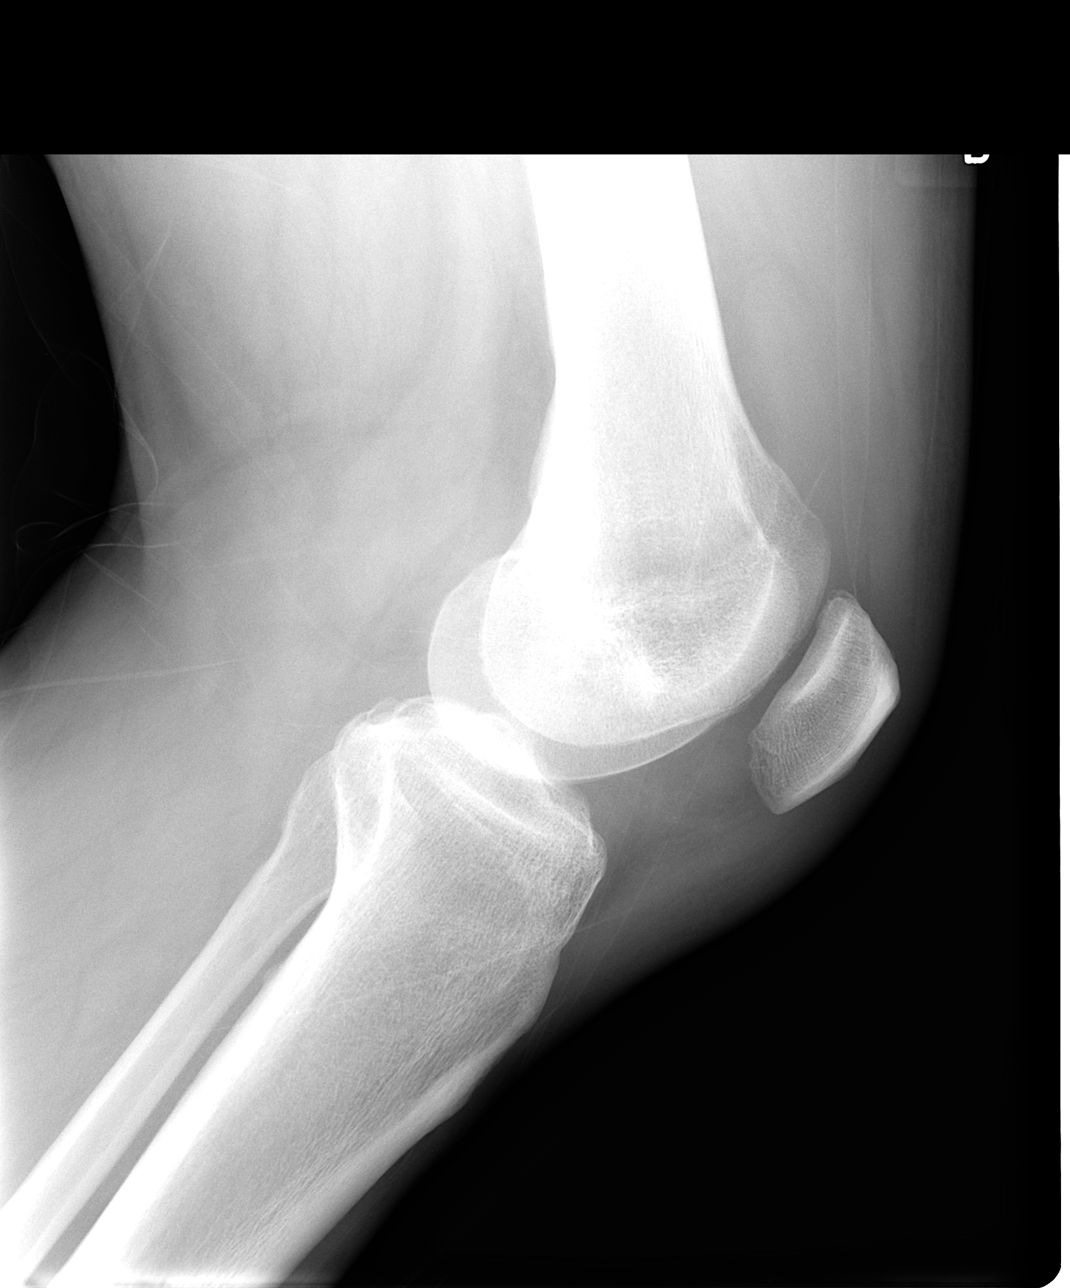

[4 of 4 positions shown; findings below may reference images not displayed]

FINDINGS: The bones of the left knee are adequately mineralized. There is
beaking of the tibial spines. The joint spaces are preserved. There
may be a small suprapatellar effusion. There is no lytic or blastic
lesion or periosteal reaction.
IMPRESSION: There is mild degenerative change of the left knee. A small effusion
is suspected superiorly. No acute bony abnormality is demonstrated.

## 2018-01-09 ENCOUNTER — Other Ambulatory Visit: Payer: Self-pay

## 2018-01-09 ENCOUNTER — Emergency Department (HOSPITAL_COMMUNITY)
Admission: EM | Admit: 2018-01-09 | Discharge: 2018-01-09 | Disposition: A | Discharge status: Home / Self Care | Payer: BLUE CROSS/BLUE SHIELD | Attending: Emergency Medicine | Admitting: Emergency Medicine

## 2018-01-09 ENCOUNTER — Encounter (HOSPITAL_COMMUNITY): Payer: Self-pay | Admitting: Emergency Medicine

## 2018-01-09 DIAGNOSIS — Z7984 Long term (current) use of oral hypoglycemic drugs: Secondary | ICD-10-CM | POA: Diagnosis not present

## 2018-01-09 DIAGNOSIS — I10 Essential (primary) hypertension: Secondary | ICD-10-CM | POA: Diagnosis not present

## 2018-01-09 DIAGNOSIS — T1502XA Foreign body in cornea, left eye, initial encounter: Secondary | ICD-10-CM | POA: Insufficient documentation

## 2018-01-09 DIAGNOSIS — X58XXXA Exposure to other specified factors, initial encounter: Secondary | ICD-10-CM | POA: Insufficient documentation

## 2018-01-09 DIAGNOSIS — Y990 Civilian activity done for income or pay: Secondary | ICD-10-CM | POA: Diagnosis not present

## 2018-01-09 DIAGNOSIS — Y9389 Activity, other specified: Secondary | ICD-10-CM | POA: Insufficient documentation

## 2018-01-09 DIAGNOSIS — Z79899 Other long term (current) drug therapy: Secondary | ICD-10-CM | POA: Diagnosis not present

## 2018-01-09 DIAGNOSIS — T1592XA Foreign body on external eye, part unspecified, left eye, initial encounter: Secondary | ICD-10-CM

## 2018-01-09 DIAGNOSIS — Y929 Unspecified place or not applicable: Secondary | ICD-10-CM | POA: Diagnosis not present

## 2018-01-09 DIAGNOSIS — E119 Type 2 diabetes mellitus without complications: Secondary | ICD-10-CM | POA: Diagnosis not present

## 2018-01-09 DIAGNOSIS — H5712 Ocular pain, left eye: Secondary | ICD-10-CM | POA: Diagnosis present

## 2018-01-09 MED ORDER — TETRACAINE HCL 0.5 % OP SOLN
2.0000 [drp] | Freq: Once | OPHTHALMIC | Status: AC
  Administered 2018-01-09: 2 [drp] via OPHTHALMIC
  Filled 2018-01-09: qty 4

## 2018-01-09 MED ORDER — ERYTHROMYCIN 5 MG/GM OP OINT
TOPICAL_OINTMENT | Freq: Once | OPHTHALMIC | Status: AC
  Administered 2018-01-09: 17:00:00 via OPHTHALMIC
  Filled 2018-01-09: qty 3.5

## 2018-01-09 MED ORDER — FLUORESCEIN SODIUM 1 MG OP STRP
1.0000 | ORAL_STRIP | Freq: Once | OPHTHALMIC | Status: AC
  Administered 2018-01-09: 1 via OPHTHALMIC
  Filled 2018-01-09: qty 1

## 2018-01-09 NOTE — ED Triage Notes (Addendum)
Patient c/o left eye pain. Per patient flash burn from wielding last week at work. Patient denies reporting burn at work, stating "I've had them before and they go away but this one is just getting worse." Patient states vision in left eye went white all of a sudden today. Per patient feels like something is in eye.  Patient reports using similasan complete eye drops with no relief. Per patient blurred vision in eye and clear drainage. Sensitive to light.

## 2018-01-09 NOTE — Discharge Instructions (Addendum)
Apply the erythromycin ointment in your left eye 3 times daily for the next 7 days.

## 2018-01-09 NOTE — ED Provider Notes (Signed)
[facility] EMERGENCY DEPARTMENT Provider Note   CSN: [id number] Arrival date & time: 01/09/18  1416     History   Chief Complaint Chief Complaint  Patient presents with  . Eye Pain    HPI Bruce Mora is a 47 y.o. male with past medical history as outlined below, most significant for diabetes and hypertension, presenting with a one-week history of left eye pain and decreased visual acuity.  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  He reports decreased vision in his left eye along with pain and photophobia.  He had an episode today where the vision in his left eye went completely white but has since improved, still describing blurred vision.  He has a foreign body sensation.  He has been using some Similasan brand eyedrops without relief of symptoms.  Had clear drainage from the eye.  He wears corrective lenses, no contacts.  His ophthalmologist is in [place].   HPI  Past Medical History:  Diagnosis Date  . Atelectasis    chorine gas exposure, hospitalization  . Diabetes mellitus   . Diabetic neuropathy ([place])   . Diabetic retinopathy    laser surgery - 2010  . Dyslipidemia   . ED (erectile dysfunction)   . History of hepatitis B vaccination   . Hypertension   . Microalbuminuria   . Refused pneumococcal vaccine 03/2012    Patient Active Problem List   Diagnosis Date Noted  . Decreased pulses in feet 04/20/2012  . Dyspnea 04/20/2012  . Diabetes type 2, uncontrolled ([place]) 04/06/2012  . Microalbuminuria 04/06/2012  . Leg swelling 04/06/2012  . Erectile dysfunction 04/06/2012  . Type II or unspecified type diabetes mellitus without mention of complication, uncontrolled 02/25/2011  . Essential hypertension, benign 02/25/2011  . Hyperlipidemia 02/25/2011  . Screening for prostate cancer 02/25/2011  . Diabetic neuropathy ([place])  02/25/2011  . Tinea pedis 02/25/2011  . Genital warts 02/25/2011  . General medical examination 02/25/2011    Past Surgical History:  Procedure Laterality Date  . CHOLECYSTECTOMY    . HERNIA REPAIR     umbilical x 2  . WISDOM TOOTH EXTRACTION          Home Medications    Prior to Admission medications   Medication Sig Start Date End Date Taking? Authorizing Provider  Alogliptin Benzoate (NESINA) 12.5 MG TABS Take 1 tablet by mouth daily. 04/09/12   Tysinger, Camelia Eng, PA-C  azithromycin (ZITHROMAX Z-PAK) 250 MG tablet Take 2 tablets by mouth on day one followed by one tablet daily for 4 days. 04/01/13   Evalee Jefferson, PA-C  benzonatate (TESSALON) 200 MG capsule Take 1 capsule (200 mg total) by mouth 3 (three) times daily as needed for cough. 04/01/13   Evalee Jefferson, PA-C  ezetimibe-simvastatin (VYTORIN) 10-40 MG per tablet Take 1 tablet by mouth at bedtime. 04/09/12 05/19/13  Tysinger, Camelia Eng, PA-C  insulin detemir (LEVEMIR) 100 UNIT/ML injection Inject 15 Units into the skin daily. 04/09/12   Tysinger, Camelia Eng, PA-C  lisinopril (PRINIVIL,ZESTRIL) 40 MG tablet Take 80 mg by mouth daily.     [provider]  Phenylephrine-APAP-Guaifenesin ([place] FAST-MAX COLD & SINUS) 10-650-400 MG/20ML LIQD Take 20 mLs by mouth every 4 (four) hours.    [provider]  pioglitazone-metformin (ACTOPLUS MET) 15-850 MG per tablet TAKE ONE TABLET BY MOUTH TWICE DAILY WITH MEALS 04/01/12  Tysinger, Camelia Eng, PA-C    Family History Family History  Problem Relation Age of Onset  . Diabetes Mother   . Cancer Mother        breast  . Arthritis Mother   . Diabetes Father   . Cancer Maternal Grandmother        died of lung cancer, hx/o breast cancer  . Stroke Maternal Aunt   . Hypertension Other        throughout both sides of family  . Heart disease Neg Hx     Social History Social History   Tobacco Use  . Smoking status: Never Smoker  . Smokeless tobacco: Never Used  Substance Use  Topics  . Alcohol use: Yes    Alcohol/week: 12.0 standard drinks    Types: 12 Cans of beer per week  . Drug use: No     Allergies   Asa buff (mag [buffered aspirin]; Glucophage [metformin hydrochloride]; Ibuprofen; Lipitor [atorvastatin calcium]; and Prilosec [omeprazole magnesium]   Review of Systems Review of Systems  Constitutional: Negative for chills and fever.  HENT: Positive for rhinorrhea. Negative for congestion, ear pain, sinus pressure, sore throat, trouble swallowing and voice change.   Eyes: Positive for photophobia, pain, redness and visual disturbance. Negative for discharge.  Respiratory: Negative for cough, shortness of breath, wheezing and stridor.   Cardiovascular: Negative for chest pain.  Gastrointestinal: Negative for abdominal pain.  Genitourinary: Negative.      Physical Exam Updated Vital Signs BP (!) 151/94 (BP Location: Right Arm) Comment: recheck  Pulse 88   Temp 98.2 F (36.8 C) (Oral)   Resp 16   Ht _0  (1.803 m)   Wt 94.3 kg   SpO2 100%   BMI 29.01 kg/m   Physical Exam Constitutional:      Appearance: He is well-developed.  HENT:     Head: Normocephalic and atraumatic.     Right Ear: Tympanic membrane and ear canal normal.     Left Ear: Tympanic membrane and ear canal normal.     Nose: Rhinorrhea present.     Mouth/Throat:     Pharynx: Uvula midline. No oropharyngeal exudate or posterior oropharyngeal erythema.     Tonsils: No tonsillar abscesses.  Eyes:     General: Lids are normal.        Left eye: Foreign body present.    Extraocular Movements: Extraocular movements intact.     Left eye: Normal extraocular motion and no nystagmus.     Conjunctiva/sclera:     Left eye: Left conjunctiva is injected. No chemosis, exudate or hemorrhage.    Pupils: Pupils are equal, round, and reactive to light.     Funduscopic exam:       Left eye: No hemorrhage or exudate.     Slit lamp exam:    Left eye: Anterior chamber quiet. Corneal  ulcer, foreign body and photophobia present. No corneal flare.     Comments: Visual acuity bilateral distance 20/30, right 20/40, left 20/40.  Small dark foreign body at the 7 o'clock position of the left cornea.  Cardiovascular:     Rate and Rhythm: Normal rate.     Heart sounds: Normal heart sounds.  Pulmonary:     Effort: Pulmonary effort is normal. No respiratory distress.     Breath sounds: No wheezing or rales.  Abdominal:     Palpations: Abdomen is soft.     Tenderness: There is no abdominal tenderness.  Musculoskeletal: Normal range of motion.  Skin:  General: Skin is warm and dry.     Findings: No rash.  Neurological:     Mental Status: He is alert and oriented to person, place, and time.      ED Treatments / Results  Labs (all labs ordered are listed, but only abnormal results are displayed) Labs Reviewed - No data to display  EKG None  Radiology No results found.  Procedures .Foreign Body Removal Date/Time: 01/09/2018 4:56 PM Performed by: Evalee Jefferson, PA-C Authorized by: Evalee Jefferson, PA-C  Consent: Verbal consent obtained. Consent given by: patient Patient understanding: patient states understanding of the procedure being performed Patient identity confirmed: verbally with patient Body area: eye Location details: left cornea  Anesthesia: Local Anesthetic: proparacaine drops  Sedation: Patient sedated: no  Localization method: visualized Removal mechanism: moist cotton swab Eye examined with fluorescein. Fluorescein uptake. Corneal abrasion size: small Corneal abrasion location: medial No residual rust ring present. Dressing: antibiotic ointment Depth: superficial Complexity: simple 1 objects recovered. Objects recovered: foreign body. Post-procedure assessment: foreign body removed Patient tolerance: Patient tolerated the procedure well with no immediate complications Comments: Black small piece of hard material that dissolved with  pressure and mixing with saline.  Pt endorses it is probably a piece of carbon, byproduct of welding.    (including critical care time)   Medications Ordered in ED Medications  tetracaine (PONTOCAINE) 0.5 % ophthalmic solution 2 drop (2 drops Both Eyes Given by Other 01/09/18 1655)  fluorescein ophthalmic strip 1 strip (1 strip Both Eyes Given by Other 01/09/18 1655)  erythromycin ophthalmic ointment ( Left Eye Given 01/09/18 1656)     Initial Impression / Assessment and Plan / ED Course  I have reviewed the triage vital signs and the nursing notes.  Pertinent labs & imaging results that were available during my care of the patient were reviewed by me and considered in my medical decision making (see chart for details).     Patient was provided with erythromycin ophthalmic ointment, first application applied here.  He was instructed to obtain close follow-up with his ophthalmologist if his symptoms are not completely improving over the next 2 to 3 days.  I suspect that this injury will heal well as it is a very small superficial ulceration.  PRN follow-up anticipated.  Patient is current with his tetanus vaccine.  Final Clinical Impressions(s) / ED Diagnoses   Final diagnoses:  Foreign body of left eye, initial encounter    ED Discharge Orders    None       Landis Martins 01/09/18 1701    Carmin Muskrat, MD 01/09/18 2352

## 2018-08-26 ENCOUNTER — Other Ambulatory Visit: Payer: Self-pay

## 2018-08-26 DIAGNOSIS — Z20822 Contact with and (suspected) exposure to covid-19: Secondary | ICD-10-CM

## 2018-08-28 LAB — NOVEL CORONAVIRUS, NAA: SARS-CoV-2, NAA: NOT DETECTED

## 2018-08-30 ENCOUNTER — Telehealth: Payer: Self-pay | Admitting: *Deleted

## 2018-08-30 NOTE — Telephone Encounter (Signed)
Patient is calling for test results- notified negative. Patient was tested for possible exposure at work. No symptoms. Patient advised to follow up with PCP for changes. 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

## 2020-08-24 ENCOUNTER — Encounter: Payer: Self-pay | Admitting: *Deleted

## 2020-08-27 ENCOUNTER — Ambulatory Visit: Payer: BC Managed Care – PPO | Admitting: Registered Nurse

## 2020-08-27 ENCOUNTER — Ambulatory Visit
Admission: RE | Admit: 2020-08-27 | Discharge: 2020-08-27 | Disposition: A | Discharge status: Home / Self Care | Payer: BC Managed Care – PPO | Attending: Gastroenterology | Admitting: Gastroenterology

## 2020-08-27 ENCOUNTER — Encounter: Payer: Self-pay | Admitting: *Deleted

## 2020-08-27 ENCOUNTER — Encounter
Admission: RE | Disposition: A | Discharge status: Home / Self Care | Payer: Self-pay | Source: Home / Self Care | Attending: Gastroenterology

## 2020-08-27 DIAGNOSIS — D125 Benign neoplasm of sigmoid colon: Secondary | ICD-10-CM | POA: Diagnosis not present

## 2020-08-27 DIAGNOSIS — R634 Abnormal weight loss: Secondary | ICD-10-CM | POA: Diagnosis not present

## 2020-08-27 DIAGNOSIS — Z888 Allergy status to other drugs, medicaments and biological substances status: Secondary | ICD-10-CM | POA: Insufficient documentation

## 2020-08-27 DIAGNOSIS — K2970 Gastritis, unspecified, without bleeding: Secondary | ICD-10-CM | POA: Diagnosis not present

## 2020-08-27 DIAGNOSIS — Z886 Allergy status to analgesic agent status: Secondary | ICD-10-CM | POA: Insufficient documentation

## 2020-08-27 DIAGNOSIS — R197 Diarrhea, unspecified: Secondary | ICD-10-CM | POA: Insufficient documentation

## 2020-08-27 DIAGNOSIS — Z794 Long term (current) use of insulin: Secondary | ICD-10-CM | POA: Diagnosis not present

## 2020-08-27 DIAGNOSIS — E785 Hyperlipidemia, unspecified: Secondary | ICD-10-CM | POA: Insufficient documentation

## 2020-08-27 DIAGNOSIS — E1140 Type 2 diabetes mellitus with diabetic neuropathy, unspecified: Secondary | ICD-10-CM | POA: Insufficient documentation

## 2020-08-27 DIAGNOSIS — Z7984 Long term (current) use of oral hypoglycemic drugs: Secondary | ICD-10-CM | POA: Diagnosis not present

## 2020-08-27 DIAGNOSIS — Z1211 Encounter for screening for malignant neoplasm of colon: Secondary | ICD-10-CM | POA: Diagnosis not present

## 2020-08-27 DIAGNOSIS — E11319 Type 2 diabetes mellitus with unspecified diabetic retinopathy without macular edema: Secondary | ICD-10-CM | POA: Insufficient documentation

## 2020-08-27 DIAGNOSIS — Z9049 Acquired absence of other specified parts of digestive tract: Secondary | ICD-10-CM | POA: Diagnosis not present

## 2020-08-27 DIAGNOSIS — Z79899 Other long term (current) drug therapy: Secondary | ICD-10-CM | POA: Insufficient documentation

## 2020-08-27 DIAGNOSIS — I10 Essential (primary) hypertension: Secondary | ICD-10-CM | POA: Insufficient documentation

## 2020-08-27 HISTORY — PX: COLONOSCOPY WITH PROPOFOL: SHX5780

## 2020-08-27 HISTORY — PX: ESOPHAGOGASTRODUODENOSCOPY (EGD) WITH PROPOFOL: SHX5813

## 2020-08-27 LAB — GLUCOSE, CAPILLARY: Glucose-Capillary: 86 mg/dL (ref 70–99)

## 2020-08-27 SURGERY — ESOPHAGOGASTRODUODENOSCOPY (EGD) WITH PROPOFOL
Anesthesia: General

## 2020-08-27 MED ORDER — PROPOFOL 500 MG/50ML IV EMUL
INTRAVENOUS | Status: AC
  Filled 2020-08-27: qty 100

## 2020-08-27 MED ORDER — LIDOCAINE HCL (PF) 2 % IJ SOLN
INTRAMUSCULAR | Status: AC
  Filled 2020-08-27: qty 10

## 2020-08-27 MED ORDER — PROPOFOL 500 MG/50ML IV EMUL
INTRAVENOUS | Status: DC | PRN
  Administered 2020-08-27: 150 ug/kg/min via INTRAVENOUS

## 2020-08-27 MED ORDER — PHENYLEPHRINE HCL (PRESSORS) 10 MG/ML IV SOLN
INTRAVENOUS | Status: AC
  Filled 2020-08-27: qty 1

## 2020-08-27 MED ORDER — LIDOCAINE HCL (CARDIAC) PF 100 MG/5ML IV SOSY
PREFILLED_SYRINGE | INTRAVENOUS | Status: DC | PRN
Start: 2020-08-27 — End: 2020-08-27
  Administered 2020-08-27: 100 mg via INTRAVENOUS

## 2020-08-27 MED ORDER — PROPOFOL 10 MG/ML IV BOLUS
INTRAVENOUS | Status: DC | PRN
  Administered 2020-08-27: 90 mg via INTRAVENOUS

## 2020-08-27 MED ORDER — SODIUM CHLORIDE 0.9 % IV SOLN
INTRAVENOUS | Status: DC
  Administered 2020-08-27: 20 mL/h via INTRAVENOUS

## 2020-08-27 MED ORDER — PROPOFOL 500 MG/50ML IV EMUL
INTRAVENOUS | Status: AC
  Filled 2020-08-27: qty 50

## 2020-08-27 NOTE — Op Note (Signed)
[facility] Patient Name: Bruce Mora Procedure Date: 08/27/2020 6:57 AM MRN: [id number] Account #: [id number] Date of Birth: 1970-12-24 Admit Type: Outpatient Age: 50 Room: [facility] ENDO ROOM 2 Gender: Male Note Status: Finalized Procedure:             Colonoscopy Indications:           Screening for colorectal malignant neoplasm Providers:             Annamaria Helling DO, DO Referring MD:          No Local Md, MD (Referring MD) Medicines:             Monitored Anesthesia Care Complications:         No immediate complications. Estimated blood loss:                         Minimal. Procedure:             Pre-Anesthesia Assessment:                        - Prior to the procedure, a History and Physical was                         performed, and patient medications and allergies were                         reviewed. The patient is competent. The risks and                         benefits of the procedure and the sedation options and                         risks were discussed with the patient. All questions                         were answered and informed consent was obtained.                         Patient identification and proposed procedure were                         verified by the physician, the nurse, the anesthetist                         and the technician in the endoscopy suite. Mental                         Status Examination: alert and oriented. Airway                         Examination: normal oropharyngeal airway and neck                         mobility. Respiratory Examination: clear to                         auscultation. CV Examination: RRR, no murmurs, no S3  or S4. Prophylactic Antibiotics: The patient does not                         require prophylactic antibiotics. Prior                         Anticoagulants: The patient has taken no previous                         anticoagulant or  antiplatelet agents. ASA Grade                         Assessment: II - A patient with mild systemic disease.                         After reviewing the risks and benefits, the patient                         was deemed in satisfactory condition to undergo the                         procedure. The anesthesia plan was to use monitored                         anesthesia care (MAC). Immediately prior to                         administration of medications, the patient was                         re-assessed for adequacy to receive sedatives. The                         heart rate, respiratory rate, oxygen saturations,                         blood pressure, adequacy of pulmonary ventilation, and                         response to care were monitored throughout the                         procedure. The physical status of the patient was                         re-assessed after the procedure.                        After obtaining informed consent, the colonoscope was                         passed under direct vision. Throughout the procedure,                         the patient's blood pressure, pulse, and oxygen                         saturations were monitored continuously. The  Colonoscope was introduced through the anus and                         advanced to the the terminal ileum. The colonoscopy                         was performed without difficulty. The patient                         tolerated the procedure well. The quality of the bowel                         preparation was good with suctioning. Findings:      The perianal and digital rectal examinations were normal. Pertinent       negatives include normal sphincter tone.      Two sessile polyps were found in the distal sigmoid colon. The polyps       were 2 to 3 mm in size. These polyps were removed with a cold biopsy       forceps. Resection and retrieval were complete. Estimated blood loss was        minimal.      The terminal ileum appeared normal.      The entire examined colon appeared normal on direct and retroflexion       views. Impression:            - Two 2 to 3 mm polyps in the distal sigmoid colon,                         removed with a cold biopsy forceps. Resected and                         retrieved.                        - The examined portion of the ileum was normal.                        - The entire examined colon is normal on direct and                         retroflexion views.                        - Two 2 to 3 mm, non-bleeding polyps in the distal                         sigmoid colon, removed with a cold biopsy forceps.                         Resected and retrieved. Recommendation:        - Discharge patient to home.                        - Patient has a contact number available for                         emergencies. The signs and symptoms of potential  delayed complications were discussed with the patient.                         Return to normal activities tomorrow. Written                         discharge instructions were provided to the patient.                        - Resume previous diet.                        - Continue present medications.                        - Await pathology results.                        - Repeat colonoscopy for surveillance based on                         pathology results.                        - Return to referring physician as previously                         scheduled. Procedure Code(s):     --- Professional ---                        [phone], Colonoscopy, flexible; with biopsy, single or                         multiple Diagnosis Code(s):     --- Professional ---                        Z12.11, Encounter for screening for malignant neoplasm                         of colon                        K63.5, Polyp of colon CPT copyright 2019 American Medical Association. All rights  reserved. The codes documented in this report are preliminary and upon coder review may  be revised to meet current compliance requirements. Volney American, DO Annamaria Helling DO, DO 08/27/2020 8:57:03 AM This report has been signed electronically. Number of Addenda: 0 Note Initiated On: 08/27/2020 6:57 AM Scope Withdrawal Time: 0 hours 28 minutes 4 seconds  Total Procedure Duration: 0 hours 53 minutes 9 seconds  Estimated Blood Loss:  Estimated blood loss was minimal.      [facility]

## 2020-08-27 NOTE — Anesthesia Postprocedure Evaluation (Signed)
Anesthesia Post Note  Patient: Bruce Mora  Procedure(s) Performed: ESOPHAGOGASTRODUODENOSCOPY (EGD) WITH PROPOFOL COLONOSCOPY WITH PROPOFOL  Patient location during evaluation: Phase II Anesthesia Type: General Level of consciousness: awake and alert, awake and oriented Pain management: pain level controlled Vital Signs Assessment: post-procedure vital signs reviewed and stable Respiratory status: spontaneous breathing, nonlabored ventilation and respiratory function stable Cardiovascular status: blood pressure returned to baseline and stable Postop Assessment: no apparent nausea or vomiting Anesthetic complications: no   No notable events documented.   Last Vitals:  Vitals:   08/27/20 0900 08/27/20 0910  BP: (!) 138/91 (!) 154/79  Pulse: 81 78  Resp: 17 14  Temp:    SpO2: 100% 100%    Last Pain:  Vitals:   08/27/20 0659  TempSrc: Temporal  PainSc: 0-No pain                 Phill Mutter

## 2020-08-27 NOTE — H&P (Signed)
[facility] Pre-Procedure H&P   Patient ID: Bruce [facility]a is a 50 y.o. male.  Gastroenterology Provider: Annamaria Helling, DO  Referring Provider: Laurine Blazer, PA PCP: [facility]  Date: 08/27/2020  HPI Bruce Mora is a 50 y.o. male who presents today for upper endoscopy and colonoscopy for diarrhea, weight loss, and screening colonoscopy. Pt seen in the office noting multiple loose stools per day and ~25 lb weight loss in 8-9 months (unintentional). Stools have no form, no blood/melena. His diet nor appetite have changed and he denies abdominal pain and early satiety. Was discovered to have a low fecal elastase and started on creon within the last week and has noted some improvement. Denies reflux, dysphagia/odynophagia. No tobacco or nsaid use. No oac or anti plt Pt has stopped drinking alcohol since last Thursday Negative cologuard in august 2021.  Patient denies nausea, vomiting, coffee ground emesis, hematemesis, abdominal pain, diarrhea, constipation, melena, hematochezia, fever, chills, dysphagia, odynophagia, jaundice, heartburn/reflux.  Past Medical History:  Diagnosis Date   Atelectasis    chorine gas exposure, hospitalization   Diabetes mellitus    Diabetic neuropathy ([place])    Diabetic retinopathy    laser surgery - 2010   Dyslipidemia    ED (erectile dysfunction)    History of hepatitis B vaccination    Hypertension    Microalbuminuria    Refused pneumococcal vaccine 03/2012    Past Surgical History:  Procedure Laterality Date   CHOLECYSTECTOMY     HERNIA REPAIR     umbilical x 2   WISDOM TOOTH EXTRACTION      Family History Mother- pt unsure whether she had pancreatic or liver cancer Aunt (mother's side) - colon cancer No other h/o GI disease or malignancy  Review of Systems  Constitutional:  Positive for unexpected weight change (loss- 25 lbs in 8-9 months). Negative for activity change,  appetite change, chills, fatigue and fever.  HENT:  Negative for trouble swallowing and voice change.   Respiratory:  Negative for shortness of breath.   Cardiovascular:  Negative for chest pain and palpitations.  Gastrointestinal:  Positive for diarrhea. Negative for abdominal distention, abdominal pain, anal bleeding, blood in stool, constipation, nausea and vomiting.  Musculoskeletal:  Negative for arthralgias and myalgias.  Skin:  Negative for color change and pallor.  Neurological:  Negative for dizziness, syncope and weakness.  Psychiatric/Behavioral:  Negative for confusion. The patient is not nervous/anxious.   All other systems reviewed and are negative.   Medications No current facility-administered medications on file prior to encounter.   Current Outpatient Medications on File Prior to Encounter  Medication Sig Dispense Refill   Dulaglutide (TRULICITY) 1.5 HQ/7.5FF SOPN Inject 1.5 mg into the skin once a week.     hydrochlorothiazide (HYDRODIURIL) 25 MG tablet Take 25 mg by mouth daily.     lisinopril (ZESTRIL) 20 MG tablet Take 20 mg by mouth daily.     metFORMIN (GLUCOPHAGE-XR) 500 MG 24 hr tablet Take 500 mg by mouth 2 (two) times daily with a meal.     sildenafil (VIAGRA) 50 MG tablet Take 50 mg by mouth daily as needed for erectile dysfunction.     Alogliptin Benzoate (NESINA) 12.5 MG TABS Take 1 tablet by mouth daily. (Patient not taking: Reported on 08/24/2020) 30 tablet 5   azithromycin (ZITHROMAX Z-PAK) 250 MG tablet Take 2 tablets by mouth on day one followed by one tablet daily for 4 days. (Patient not taking: Reported on  08/24/2020) 6 each 0   benzonatate (TESSALON) 200 MG capsule Take 1 capsule (200 mg total) by mouth 3 (three) times daily as needed for cough. (Patient not taking: Reported on 08/24/2020) 30 capsule 0   ezetimibe-simvastatin (VYTORIN) 10-40 MG per tablet Take 1 tablet by mouth at bedtime. (Patient not taking: Reported on 08/24/2020) 30 tablet 2   insulin  detemir (LEVEMIR) 100 UNIT/ML injection Inject 15 Units into the skin daily. (Patient not taking: Reported on 08/24/2020)     Phenylephrine-APAP-Guaifenesin ([employer] FAST-MAX COLD & SINUS) 10-650-400 MG/20ML LIQD Take 20 mLs by mouth every 4 (four) hours.     pioglitazone-metformin (ACTOPLUS MET) 15-850 MG per tablet TAKE ONE TABLET BY MOUTH TWICE DAILY WITH MEALS (Patient not taking: Reported on 08/24/2020) 60 tablet 4    Pertinent medications related to GI and procedure were reviewed by me with the patient prior to the procedure   Current Facility-Administered Medications:    0.9 %  sodium chloride infusion, , Intravenous, Continuous, Annamaria Helling, DO  sodium chloride         Allergies  Allergen Reactions   Asa Buff (Mag [Buffered Aspirin] Other (See Comments)    GI upset   Glucophage [Metformin Hydrochloride] Rash    Rash?   Ibuprofen Other (See Comments)    Upset stomach   Lipitor [Atorvastatin Calcium] Other (See Comments)    Myalgias and arthralgias   Prilosec [Omeprazole Magnesium] Other (See Comments)    GI upset   Allergies were reviewed by me prior to the procedure  Objective    Vitals:   08/27/20 0659  BP: (!) 148/96  Pulse: 63  Resp: 20  Temp: (!) 96.6 F (35.9 C)  TempSrc: Temporal  SpO2: 95%  Weight: 79.4 kg  Height: 5' 10.5" (1.791 m)     Physical Exam Vitals and nursing note reviewed.  Constitutional:      General: He is not in acute distress.    Appearance: Normal appearance. He is not ill-appearing, toxic-appearing or diaphoretic.  HENT:     Head: Normocephalic and atraumatic.     Nose: Nose normal.     Mouth/Throat:     Mouth: Mucous membranes are moist.     Pharynx: Oropharynx is clear.  Eyes:     General: No scleral icterus.    Extraocular Movements: Extraocular movements intact.  Cardiovascular:     Rate and Rhythm: Normal rate and regular rhythm.     Heart sounds: Normal heart sounds. No murmur heard.   No friction rub. No  gallop.  Pulmonary:     Effort: Pulmonary effort is normal. No respiratory distress.     Breath sounds: Normal breath sounds. No wheezing, rhonchi or rales.  Abdominal:     General: Abdomen is flat. Bowel sounds are normal. There is no distension.     Palpations: Abdomen is soft.     Tenderness: There is no abdominal tenderness. There is no guarding or rebound.  Musculoskeletal:     Cervical back: Neck supple.     Right lower leg: No edema.     Left lower leg: No edema.  Skin:    General: Skin is warm and dry.     Coloration: Skin is not jaundiced or pale.  Neurological:     General: No focal deficit present.     Mental Status: He is alert and oriented to person, place, and time. Mental status is at baseline.  Psychiatric:        Mood and Affect:  Mood normal.        Behavior: Behavior normal.        Thought Content: Thought content normal.        Judgment: Judgment normal.     Assessment:  Bruce Mora is a 50 y.o. male  who presents today for upper endoscopy and colonoscopy for diarrhea, weight loss, and screening colonoscopy.  Plan:  Esophagogastroduodenoscopy colonoscopy with possible intervention today  Esophagogastroduodenoscopy with possible biopsy, control of bleeding, polypectomy, and interventions as necessary has been discussed with the patient/patient representative. Informed consent was obtained from the patient/patient representative after explaining the indication, nature, and risks of the procedure including but not limited to death, bleeding, perforation, missed neoplasm/lesions, cardiorespiratory compromise, and reaction to medications. Opportunity for questions was given and appropriate answers were provided. Patient/patient representative has verbalized understanding is amenable to undergoing the procedure.  Colonoscopy with possible biopsy, control of bleeding, polypectomy, and interventions as necessary has been discussed with the patient/patient  representative. Informed consent was obtained from the patient/patient representative after explaining the indication, nature, and risks of the procedure including but not limited to death, bleeding, perforation, missed neoplasm/lesions, cardiorespiratory compromise, and reaction to medications. Opportunity for questions was given and appropriate answers were provided. Patient/patient representative has verbalized understanding is amenable to undergoing the procedure.  Annamaria Helling, DO  [facility]  Portions of the record may have been created with voice recognition software. Occasional wrong-word or 'sound-a-like' substitutions may have occurred due to the inherent limitations of voice recognition software.  Read the chart carefully and recognize, using context, where substitutions may have occurred.

## 2020-08-27 NOTE — Interval H&P Note (Signed)
History and Physical Interval Note: Preprocedure H&P from 08/27/20 was reviewed and there was no interval change after seeing and examining the patient.  Written consent was obtained from the patient after discussion of risks, benefits, and alternatives. Patient has consented to proceed with esophagogastroduodenoscopy and colonoscopy with possible intervention   08/27/2020 7:24 AM  Bruce Mora  has presented today for surgery, with the diagnosis of Weight Loss r63.4, loose stools r19.5.  The various methods of treatment have been discussed with the patient and family. After consideration of risks, benefits and other options for treatment, the patient has consented to  Procedure(s): ESOPHAGOGASTRODUODENOSCOPY (EGD) WITH PROPOFOL (N/A) COLONOSCOPY WITH PROPOFOL (N/A) as a surgical intervention.  The patient's history has been reviewed, patient examined, no change in status, stable for surgery.  I have reviewed the patient's chart and labs.  Questions were answered to the patient's satisfaction.     Annamaria Helling

## 2020-08-27 NOTE — Transfer of Care (Signed)
Immediate Anesthesia Transfer of Care Note  Patient: Bruce Mora  Procedure(s) Performed: ESOPHAGOGASTRODUODENOSCOPY (EGD) WITH PROPOFOL COLONOSCOPY WITH PROPOFOL  Patient Location: Endoscopy Unit  Anesthesia Type:General  Level of Consciousness: drowsy  Airway & Oxygen Therapy: Patient Spontanous Breathing  Post-op Assessment: Report given to RN and Post -op Vital signs reviewed and stable  Post vital signs: Reviewed and stable  Last Vitals:  Vitals Value Taken Time  BP 112/69 08/27/20 0842  Temp    Pulse 79 08/27/20 0842  Resp 16 08/27/20 0842  SpO2 98 % 08/27/20 0842  Vitals shown include unvalidated device data.  Last Pain:  Vitals:   08/27/20 0659  TempSrc: Temporal  PainSc: 0-No pain         Complications: No notable events documented.

## 2020-08-27 NOTE — Anesthesia Preprocedure Evaluation (Signed)
Anesthesia Evaluation  Patient identified by MRN, date of birth, ID band Patient awake    Reviewed: Allergy & Precautions, NPO status , Patient's Chart, lab work & pertinent test results  Airway Mallampati: II  TM Distance: >3 FB Neck ROM: Full    Dental  (+) Poor Dentition   Pulmonary shortness of breath and with exertion,    Pulmonary exam normal        Cardiovascular hypertension, Pt. on medications negative cardio ROS Normal cardiovascular exam     Neuro/Psych negative neurological ROS  negative psych ROS   GI/Hepatic negative GI ROS, Neg liver ROS,   Endo/Other  negative endocrine ROSdiabetes, Type 2, Oral Hypoglycemic Agents  Renal/GU negative Renal ROS  negative genitourinary   Musculoskeletal negative musculoskeletal ROS (+)   Abdominal   Peds negative pediatric ROS (+)  Hematology negative hematology ROS (+)   Anesthesia Other Findings . Hyperlipidemia  . Hypertension  . Type 2 diabetes mellitus (CMS-HCC)     Reproductive/Obstetrics negative OB ROS                             Anesthesia Physical Anesthesia Plan  ASA: 3  Anesthesia Plan: General   Post-op Pain Management:    Induction: Intravenous  PONV Risk Score and Plan: 2 and Propofol infusion and TIVA  Airway Management Planned: Nasal Cannula  Additional Equipment:   Intra-op Plan:   Post-operative Plan:   Informed Consent: I have reviewed the patients History and Physical, chart, labs and discussed the procedure including the risks, benefits and alternatives for the proposed anesthesia with the patient or authorized representative who has indicated his/her understanding and acceptance.       Plan Discussed with: CRNA, Anesthesiologist and Surgeon  Anesthesia Plan Comments:         Anesthesia Quick Evaluation

## 2020-08-27 NOTE — Op Note (Signed)
[facility] Patient Name: Bruce Mora Procedure Date: 08/27/2020 6:58 AM MRN: [id number] Account #: [id number] Date of Birth: Aug 09, 1970 Admit Type: Outpatient Age: 50 Room: [facility] ENDO ROOM 2 Gender: Male Note Status: Finalized Procedure:             Upper GI endoscopy Indications:           Suspected gastritis, Diarrhea, Weight loss Providers:             Annamaria Helling DO, DO Referring MD:          No Local Md, MD (Referring MD) Medicines:             Monitored Anesthesia Care Complications:         No immediate complications. Procedure:             Pre-Anesthesia Assessment:                        - Prior to the procedure, a History and Physical was                         performed, and patient medications and allergies were                         reviewed. The patient is competent. The risks and                         benefits of the procedure and the sedation options and                         risks were discussed with the patient. All questions                         were answered and informed consent was obtained.                         Patient identification and proposed procedure were                         verified by the physician. Mental Status Examination:                         alert and oriented. Airway Examination: normal                         oropharyngeal airway and neck mobility. Respiratory                         Examination: clear to auscultation. CV Examination:                         normal. Prophylactic Antibiotics: The patient does not                         require prophylactic antibiotics. Prior                         Anticoagulants: The patient has taken no previous  anticoagulant or antiplatelet agents. ASA Grade                         Assessment: II - A patient with mild systemic disease.                         After reviewing the risks and benefits, the patient                          was deemed in satisfactory condition to undergo the                         procedure. The anesthesia plan was to use moderate                         sedation / analgesia (conscious sedation). Immediately                         prior to administration of medications, the patient                         was re-assessed for adequacy to receive sedatives. The                         heart rate, respiratory rate, oxygen saturations,                         blood pressure, adequacy of pulmonary ventilation, and                         response to care were monitored throughout the                         procedure. The physical status of the patient was                         re-assessed after the procedure.                        After obtaining informed consent, the endoscope was                         passed under direct vision. Throughout the procedure,                         the patient's blood pressure, pulse, and oxygen                         saturations were monitored continuously. The Endoscope                         was introduced through the mouth, and advanced to the                         second part of duodenum. The upper GI endoscopy was                         accomplished without difficulty. The patient tolerated  the procedure well. Findings:      The esophagus was normal. GEJ 40 cm from incisors; regular z line,       Estimated blood loss: none.      Patchy minimal inflammation characterized by erosions was found in the       gastric antrum. Biopsies were taken with a cold forceps for histology.       Estimated blood loss was minimal.      The examined duodenum was normal. Biopsies were taken with a cold       forceps for histology. Estimated blood loss was minimal. Impression:            - Normal esophagus.                        - Gastritis. Biopsied.                        - Normal examined duodenum. Biopsied. Recommendation:         - Patient has a contact number available for                         emergencies. The signs and symptoms of potential                         delayed complications were discussed with the patient.                         Return to normal activities tomorrow. Written                         discharge instructions were provided to the patient.                        - Resume previous diet.                        - Continue present medications.                        - Await pathology results.                        - avoid non steroidal anti inflammatories and alcohol Procedure Code(s):     --- Professional ---                        [phone], Esophagogastroduodenoscopy, flexible,                         transoral; with biopsy, single or multiple Diagnosis Code(s):     --- Professional ---                        K29.70, Gastritis, unspecified, without bleeding                        R19.7, Diarrhea, unspecified                        R63.4, Abnormal weight loss CPT copyright 2019 [employer]. All rights reserved. The codes documented in this report are preliminary and upon coder  review may  be revised to meet current compliance requirements. Volney American, DO Annamaria Helling DO, DO 08/27/2020 8:50:05 AM This report has been signed electronically. Number of Addenda: 0 Note Initiated On: 08/27/2020 6:58 AM Estimated Blood Loss:  Estimated blood loss was minimal.      [facility]

## 2020-08-28 ENCOUNTER — Encounter: Payer: Self-pay | Admitting: Gastroenterology

## 2020-08-28 LAB — SURGICAL PATHOLOGY

## 2021-08-22 ENCOUNTER — Observation Stay (HOSPITAL_COMMUNITY)
Admission: EM | Admit: 2021-08-22 | Discharge: 2021-08-23 | Disposition: A | Discharge status: Home / Self Care | Payer: BC Managed Care – PPO | Attending: Emergency Medicine | Admitting: Emergency Medicine

## 2021-08-22 ENCOUNTER — Other Ambulatory Visit: Payer: Self-pay

## 2021-08-22 ENCOUNTER — Encounter (HOSPITAL_COMMUNITY): Payer: Self-pay | Admitting: Emergency Medicine

## 2021-08-22 ENCOUNTER — Emergency Department (HOSPITAL_COMMUNITY): Payer: BC Managed Care – PPO

## 2021-08-22 DIAGNOSIS — R531 Weakness: Principal | ICD-10-CM

## 2021-08-22 DIAGNOSIS — R270 Ataxia, unspecified: Secondary | ICD-10-CM

## 2021-08-22 DIAGNOSIS — I10 Essential (primary) hypertension: Secondary | ICD-10-CM | POA: Diagnosis not present

## 2021-08-22 DIAGNOSIS — R29898 Other symptoms and signs involving the musculoskeletal system: Secondary | ICD-10-CM | POA: Insufficient documentation

## 2021-08-22 DIAGNOSIS — E1140 Type 2 diabetes mellitus with diabetic neuropathy, unspecified: Secondary | ICD-10-CM | POA: Diagnosis not present

## 2021-08-22 DIAGNOSIS — R260 Ataxic gait: Secondary | ICD-10-CM | POA: Diagnosis not present

## 2021-08-22 DIAGNOSIS — Z794 Long term (current) use of insulin: Secondary | ICD-10-CM | POA: Diagnosis not present

## 2021-08-22 DIAGNOSIS — E785 Hyperlipidemia, unspecified: Secondary | ICD-10-CM | POA: Insufficient documentation

## 2021-08-22 DIAGNOSIS — Q670 Congenital facial asymmetry: Secondary | ICD-10-CM | POA: Insufficient documentation

## 2021-08-22 DIAGNOSIS — Z79899 Other long term (current) drug therapy: Secondary | ICD-10-CM | POA: Insufficient documentation

## 2021-08-22 DIAGNOSIS — Z7984 Long term (current) use of oral hypoglycemic drugs: Secondary | ICD-10-CM | POA: Insufficient documentation

## 2021-08-22 DIAGNOSIS — Z20822 Contact with and (suspected) exposure to covid-19: Secondary | ICD-10-CM | POA: Insufficient documentation

## 2021-08-22 DIAGNOSIS — Z7985 Long-term (current) use of injectable non-insulin antidiabetic drugs: Secondary | ICD-10-CM | POA: Diagnosis not present

## 2021-08-22 DIAGNOSIS — I639 Cerebral infarction, unspecified: Secondary | ICD-10-CM | POA: Insufficient documentation

## 2021-08-22 DIAGNOSIS — E1169 Type 2 diabetes mellitus with other specified complication: Secondary | ICD-10-CM | POA: Diagnosis not present

## 2021-08-22 DIAGNOSIS — E1142 Type 2 diabetes mellitus with diabetic polyneuropathy: Secondary | ICD-10-CM

## 2021-08-22 LAB — HEPATIC FUNCTION PANEL
ALT: 18 U/L (ref 0–44)
AST: 17 U/L (ref 15–41)
Albumin: 3.4 g/dL — ABNORMAL LOW (ref 3.5–5.0)
Alkaline Phosphatase: 44 U/L (ref 38–126)
Bilirubin, Direct: 0.1 mg/dL (ref 0.0–0.2)
Indirect Bilirubin: 0.5 mg/dL (ref 0.3–0.9)
Total Bilirubin: 0.6 mg/dL (ref 0.3–1.2)
Total Protein: 6.2 g/dL — ABNORMAL LOW (ref 6.5–8.1)

## 2021-08-22 LAB — CBC
HCT: 37.9 % — ABNORMAL LOW (ref 39.0–52.0)
Hemoglobin: 12.5 g/dL — ABNORMAL LOW (ref 13.0–17.0)
MCH: 31.2 pg (ref 26.0–34.0)
MCHC: 33 g/dL (ref 30.0–36.0)
MCV: 94.5 fL (ref 80.0–100.0)
Platelets: 218 10*3/uL (ref 150–400)
RBC: 4.01 MIL/uL — ABNORMAL LOW (ref 4.22–5.81)
RDW: 13.2 % (ref 11.5–15.5)
WBC: 3.9 10*3/uL — ABNORMAL LOW (ref 4.0–10.5)
nRBC: 0 % (ref 0.0–0.2)

## 2021-08-22 LAB — BASIC METABOLIC PANEL
Anion gap: 6 (ref 5–15)
BUN: 11 mg/dL (ref 6–20)
CO2: 27 mmol/L (ref 22–32)
Calcium: 8.8 mg/dL — ABNORMAL LOW (ref 8.9–10.3)
Chloride: 103 mmol/L (ref 98–111)
Creatinine, Ser: 0.76 mg/dL (ref 0.61–1.24)
GFR, Estimated: >60 mL/min (ref >60)
Glucose, Bld: 138 mg/dL — ABNORMAL HIGH (ref 70–99)
Potassium: 4.3 mmol/L (ref 3.5–5.1)
Sodium: 136 mmol/L (ref 135–145)

## 2021-08-22 LAB — RESP PANEL BY RT-PCR (FLU A&B, COVID) ARPGX2
Influenza A by PCR: NEGATIVE
Influenza B by PCR: NEGATIVE
SARS Coronavirus 2 by RT PCR: NEGATIVE

## 2021-08-22 LAB — RENAL FUNCTION PANEL
Albumin: 3.8 g/dL (ref 3.5–5.0)
Anion gap: 7 (ref 5–15)
BUN: 11 mg/dL (ref 6–20)
CO2: 27 mmol/L (ref 22–32)
Calcium: 8.8 mg/dL — ABNORMAL LOW (ref 8.9–10.3)
Chloride: 102 mmol/L (ref 98–111)
Creatinine, Ser: 0.84 mg/dL (ref 0.61–1.24)
GFR, Estimated: >60 mL/min (ref >60)
Glucose, Bld: 136 mg/dL — ABNORMAL HIGH (ref 70–99)
Phosphorus: 3.3 mg/dL (ref 2.5–4.6)
Potassium: 4.3 mmol/L (ref 3.5–5.1)
Sodium: 136 mmol/L (ref 135–145)

## 2021-08-22 LAB — APTT: aPTT: 30 seconds (ref 24–36)

## 2021-08-22 LAB — PROTIME-INR
INR: 1 (ref 0.8–1.2)
Prothrombin Time: 12.6 seconds (ref 11.4–15.2)

## 2021-08-22 LAB — CBG MONITORING, ED: Glucose-Capillary: 149 mg/dL — ABNORMAL HIGH (ref 70–99)

## 2021-08-22 LAB — ETHANOL: Alcohol, Ethyl (B): <10 mg/dL (ref <10)

## 2021-08-22 MED ORDER — IOHEXOL 350 MG/ML SOLN
125.0000 mL | Freq: Once | INTRAVENOUS | Status: AC | PRN
  Administered 2021-08-22: 125 mL via INTRAVENOUS

## 2021-08-22 MED ORDER — ENOXAPARIN SODIUM 40 MG/0.4ML IJ SOSY
40.0000 mg | PREFILLED_SYRINGE | INTRAMUSCULAR | Status: DC
  Administered 2021-08-23: 40 mg via SUBCUTANEOUS
  Filled 2021-08-22: qty 0.4

## 2021-08-22 MED ORDER — SENNOSIDES-DOCUSATE SODIUM 8.6-50 MG PO TABS: 1.0000 | ORAL_TABLET | Freq: Every evening | ORAL | Status: DC | PRN

## 2021-08-22 MED ORDER — ACETAMINOPHEN 160 MG/5ML PO SOLN: 650.0000 mg | ORAL | Status: DC | PRN

## 2021-08-22 MED ORDER — LORAZEPAM 2 MG/ML IJ SOLN
0.5000 mg | Freq: Once | INTRAMUSCULAR | Status: DC
  Filled 2021-08-22: qty 1

## 2021-08-22 MED ORDER — ACETAMINOPHEN 650 MG RE SUPP: 650.0000 mg | RECTAL | Status: DC | PRN

## 2021-08-22 MED ORDER — ALPRAZOLAM 0.5 MG PO TABS
0.5000 mg | ORAL_TABLET | Freq: Once | ORAL | Status: AC
  Administered 2021-08-22: 0.5 mg via ORAL
  Filled 2021-08-22: qty 1

## 2021-08-22 MED ORDER — TAMSULOSIN HCL 0.4 MG PO CAPS: 0.4000 mg | ORAL_CAPSULE | Freq: Every day | ORAL | Status: DC

## 2021-08-22 MED ORDER — INSULIN ASPART 100 UNIT/ML IJ SOLN: 0.0000 [IU] | Freq: Three times a day (TID) | INTRAMUSCULAR | Status: DC

## 2021-08-22 MED ORDER — INSULIN ASPART 100 UNIT/ML IJ SOLN: 0.0000 [IU] | Freq: Every day | INTRAMUSCULAR | Status: DC

## 2021-08-22 MED ORDER — CLOPIDOGREL BISULFATE 75 MG PO TABS
300.0000 mg | ORAL_TABLET | Freq: Once | ORAL | Status: AC
  Administered 2021-08-22: 300 mg via ORAL
  Filled 2021-08-22: qty 4

## 2021-08-22 MED ORDER — ACETAMINOPHEN 325 MG PO TABS: 650.0000 mg | ORAL_TABLET | ORAL | Status: DC | PRN

## 2021-08-22 MED ORDER — STROKE: EARLY STAGES OF RECOVERY BOOK
Freq: Once | Status: AC
  Filled 2021-08-22: qty 1

## 2021-08-22 MED ORDER — CLOPIDOGREL BISULFATE 75 MG PO TABS
75.0000 mg | ORAL_TABLET | Freq: Every day | ORAL | Status: DC
  Administered 2021-08-23: 75 mg via ORAL
  Filled 2021-08-22: qty 1

## 2021-08-22 NOTE — ED Triage Notes (Signed)
Pt presents with weakness, facial droop, limp while walking and pain between shoulder blades since Wed around 0530 am.  Neuo checks perform, only noticed impaired gait.

## 2021-08-22 NOTE — Assessment & Plan Note (Signed)
Random glucose 138 - HgbA1c -Hold metformin and Ozempic

## 2021-08-22 NOTE — ED Provider Notes (Signed)
[facility] EMERGENCY DEPARTMENT Provider Note   CSN: [id number] Arrival date & time: 08/22/21  1441     History  Chief Complaint  Patient presents with   Weakness    Bruce Mora is a 51 y.o. male with history of essential HTN, hyperlipidemia, DMT2 with neuropathy and non-insulin dependent, microalbuminuria, and erectile dysfunction.  Pt complains of weakness and facial changes/swelling more than 24 hours ago.  Family member came home around 1 PM yesterday, noticed that the right side of his face appeared to droop and left side of his face appeared to be mildly swollen.  Patient endorses feeling overall weak over the last 3 days with difficulty walking, but possibly more on the right side than the left.  States he "feels off balance".  Also endorses tenderness between the shoulder blades, no recent injury.  Denies fever, neck stiffness, chest pain, shortness of breath, or memory impairment.  Pt figured his symptoms were due to "old age".  The history is provided by the patient and the spouse.  Weakness     Home Medications Prior to Admission medications   Medication Sig Start Date End Date Taking? Authorizing Provider  Alogliptin Benzoate (NESINA) 12.5 MG TABS Take 1 tablet by mouth daily. Patient not taking: Reported on 08/24/2020 04/09/12   Tysinger, Camelia Eng, PA-C  azithromycin (ZITHROMAX Z-PAK) 250 MG tablet Take 2 tablets by mouth on day one followed by one tablet daily for 4 days. Patient not taking: Reported on 08/24/2020 04/01/13   Evalee Jefferson, PA-C  benzonatate (TESSALON) 200 MG capsule Take 1 capsule (200 mg total) by mouth 3 (three) times daily as needed for cough. Patient not taking: Reported on 08/24/2020 04/01/13   Evalee Jefferson, PA-C  Dulaglutide (TRULICITY) 1.5 TO/6.7TI SOPN Inject 1.5 mg into the skin once a week.    [provider]  ezetimibe-simvastatin (VYTORIN) 10-40 MG per tablet Take 1 tablet by mouth at bedtime. Patient not taking: Reported on 08/24/2020  04/09/12 05/19/13  Tysinger, Camelia Eng, PA-C  hydrochlorothiazide (HYDRODIURIL) 25 MG tablet Take 25 mg by mouth daily.    [provider]  insulin detemir (LEVEMIR) 100 UNIT/ML injection Inject 15 Units into the skin daily. Patient not taking: Reported on 08/24/2020 04/09/12   Tysinger, Camelia Eng, PA-C  lisinopril (ZESTRIL) 20 MG tablet Take 20 mg by mouth daily.    [provider]  metFORMIN (GLUCOPHAGE-XR) 500 MG 24 hr tablet Take 500 mg by mouth 2 (two) times daily with a meal.    [provider]  Phenylephrine-APAP-Guaifenesin ([place] FAST-MAX COLD & SINUS) 10-650-400 MG/20ML LIQD Take 20 mLs by mouth every 4 (four) hours.    [provider]  pioglitazone-metformin (ACTOPLUS MET) 15-850 MG per tablet TAKE ONE TABLET BY MOUTH TWICE DAILY WITH MEALS Patient not taking: Reported on 08/24/2020 04/01/12   Tysinger, Camelia Eng, PA-C  sildenafil (VIAGRA) 50 MG tablet Take 50 mg by mouth daily as needed for erectile dysfunction.    [provider]      Allergies    Asa buff (mag [buffered aspirin], Glucophage [metformin hydrochloride], Ibuprofen, Lipitor [atorvastatin calcium], and Prilosec [omeprazole magnesium]    Review of Systems   Review of Systems  Neurological:  Positive for weakness.    Physical Exam Updated Vital Signs BP 136/83   Pulse 83   Temp 98 F (36.7 C) (Oral)   Resp 16   Ht _0  (1.778 m)   Wt 74.8 kg   SpO2 100%   BMI 23.68  kg/m  Physical Exam Vitals and nursing note reviewed.  Constitutional:      General: He is not in acute distress.    Appearance: He is well-developed. He is not ill-appearing, toxic-appearing or diaphoretic.  HENT:     Head: Normocephalic and atraumatic.  Eyes:     General: Lids are normal. Gaze aligned appropriately. No visual field deficit.       Right eye: No discharge.        Left eye: No discharge.     Extraocular Movements:     Right eye: Abnormal extraocular motion present.     Left eye: Abnormal  extraocular motion present.     Conjunctiva/sclera: Conjunctivae normal.     Right eye: Right conjunctiva is not injected.     Left eye: Left conjunctiva is not injected.     Pupils: Pupils are equal, round, and reactive to light.     Visual Fields:     Right eye: CF in the upper temporal quadrant. CF in the upper nasal quadrant. CF in the lower temporal quadrant. CF in the lower nasal quadrant.     Left eye: CF in the upper nasal quadrant. CF in the upper temporal quadrant. CF in the lower nasal quadrant. CF in the lower temporal quadrant.     Comments: Abnormal EOMs, unable to assess for nystagmus due to "losing track of the finger and space"  Cardiovascular:     Rate and Rhythm: Normal rate. Rhythm irregular.     Pulses: Normal pulses.     Heart sounds: No murmur heard. Pulmonary:     Effort: Pulmonary effort is normal. No respiratory distress.     Breath sounds: Normal breath sounds.  Abdominal:     Palpations: Abdomen is soft.     Tenderness: There is no abdominal tenderness.  Musculoskeletal:        General: No swelling.     Cervical back: Neck supple. No rigidity.     Right lower leg: No edema.     Left lower leg: No edema.  Skin:    General: Skin is warm and dry.     Capillary Refill: Capillary refill takes less than 2 seconds.  Neurological:     Mental Status: He is alert and oriented to person, place, and time.     GCS: GCS eye subscore is 4. GCS verbal subscore is 5. GCS motor subscore is 6.     Cranial Nerves: Facial asymmetry (Very mild of right side) present. No cranial nerve deficit or dysarthria.     Sensory: No sensory deficit.     Motor: Weakness (Right upper and lower) and pronator drift (Right) present. No tremor or seizure activity.     Coordination: Coordination abnormal. Finger-Nose-Finger Test abnormal (Right) and Heel to [facility] Test abnormal (Right).     Gait: Gait abnormal.     Comments: Mild right sided facial asymmetry without swelling.  Mild weakness of  lower extremities and right upper extremity.  Abnormal EOMs, unable to follow finger due to "losing sight of it/being unable to focus".  No apparent visual field deficits, able to count fingers.  Sensation appears grossly intact of all extremities.  No aphasia or dysarthria.  No tongue deviation.  Abnormal coordination of right extremities.  Pronator drift of right arm.  Psychiatric:        Mood and Affect: Mood normal.     ED Results / Procedures / Treatments   Labs (all labs ordered are listed, but only abnormal  results are displayed) Labs Reviewed  BASIC METABOLIC PANEL - Abnormal; Notable for the following components:      Result Value   Glucose, Bld 138 (*)    Calcium 8.8 (*)    All other components within normal limits  CBC - Abnormal; Notable for the following components:   WBC 3.9 (*)    RBC 4.01 (*)    Hemoglobin 12.5 (*)    HCT 37.9 (*)    All other components within normal limits  RENAL FUNCTION PANEL - Abnormal; Notable for the following components:   Glucose, Bld 136 (*)    Calcium 8.8 (*)    All other components within normal limits  CBG MONITORING, ED - Abnormal; Notable for the following components:   Glucose-Capillary 149 (*)    All other components within normal limits  RESP PANEL BY RT-PCR (FLU A&B, COVID) ARPGX2  PROTIME-INR  APTT  ETHANOL  URINALYSIS, ROUTINE W REFLEX MICROSCOPIC  RAPID URINE DRUG SCREEN, HOSP PERFORMED  HEPATIC FUNCTION PANEL  CBG MONITORING, ED    EKG EKG Interpretation  Date/Time:  Thursday August 22 2021 15:03:28 EDT Ventricular Rate:  104 PR Interval:  142 QRS Duration: 82 QT Interval:  326 QTC Calculation: 428 R Axis:   79 Text Interpretation: Sinus tachycardia Cannot rule out Anterior infarct , age undetermined Abnormal ECG No previous ECGs available ST elevation ikn the V2 lead alone - no old EKG Confirmed by Noemi Chapel [phone]) on 08/22/2021 3:08:02 PM  Radiology CT Angio Chest/Abd/Pel for Dissection W and/or  W/WO  Result Date: 08/22/2021 CLINICAL DATA:  Back pain, weakness, facial droop EXAM: CT ANGIOGRAPHY CHEST, ABDOMEN AND PELVIS TECHNIQUE: Non-contrast CT of the chest was initially obtained. Multidetector CT imaging through the chest, abdomen and pelvis was performed using the standard protocol during bolus administration of intravenous contrast. Multiplanar reconstructed images and MIPs were obtained and reviewed to evaluate the vascular anatomy. RADIATION DOSE REDUCTION: This exam was performed according to the departmental dose-optimization program which includes automated exposure control, adjustment of the mA and/or kV according to patient size and/or use of iterative reconstruction technique. CONTRAST:  135m OMNIPAQUE IOHEXOL 350 MG/ML SOLN COMPARISON:  None Available. FINDINGS: CTA CHEST FINDINGS Cardiovascular: There is no demonstrable mural hematoma in the thoracic aorta in the noncontrast images. Scattered coronary artery calcifications are seen. There is homogeneous enhancement in thoracic aorta. There is no demonstrable intimal flap. Contrast density in pulmonary artery branches is less than optimal. As far as seen, there is no evidence of any large central pulmonary embolism in the main pulmonary arteries. Mediastinum/Nodes: No significant lymphadenopathy is seen. Lungs/Pleura: There is no focal pulmonary consolidation. There is no pleural effusion or pneumothorax. Musculoskeletal: No acute findings are seen in bony structures in thorax. Review of the MIP images confirms the above findings. CTA ABDOMEN AND PELVIS FINDINGS VASCULAR Aorta: Normal. There is no evidence of dissection or focal aneurysmal dilation. Celiac: Normal SMA: Normal Renals: Normal IMA: Proximal course of IMA is patent. Inflow: There are atherosclerotic plaques and calcifications in iliac arteries without dissection or stenosis or aneurysmal dilation. Veins: Unremarkable. Review of the MIP images confirms the above findings.  NON-VASCULAR Hepatobiliary: There is fatty infiltration liver. Surgical clips are seen in gallbladder fossa. There is no significant dilation of bile ducts. Pancreas: No focal abnormalities are seen. Spleen: Unremarkable. Adrenals/Urinary Tract: Adrenals are not enlarged. There is no hydronephrosis. There are no renal or ureteral stones. Urinary bladder is unremarkable. Stomach/Bowel: Stomach is distended, possibly due to recent  meal. Small bowel loops are not dilated. Appendix is not dilated. There is no pericecal inflammation. There is no significant focal wall thickening in colon. There is mild diffuse wall thickening in transverse colon. There is no pericolic stranding. Lymphatic: Unremarkable. Reproductive: There are small calcifications in the prostate. Other: There is no ascites or pneumoperitoneum. Musculoskeletal: No acute findings are seen. Review of the MIP images confirms the above findings. IMPRESSION: There is no evidence of dissection in the thoracic and abdominal aorta. Major branches of thoracic and abdominal aorta are patent. There is no evidence of central pulmonary artery embolism. Scattered coronary artery calcifications are seen. There is no focal pulmonary consolidation. There is no evidence of intestinal obstruction or pneumoperitoneum. There is no hydronephrosis. Appendix is not dilated. There is mild diffuse wall thickening and transverse colon which may be an artifact due to incomplete distention all suggest chronic nonspecific colitis. There is no pericolic stranding or fluid collection. Electronically Signed   By: Elmer Picker M.D.   On: 08/22/2021 18:10   CT ANGIO HEAD NECK W WO CM  Result Date: 08/22/2021 CLINICAL DATA:  Weakness, facial droop, limp EXAM: CT ANGIOGRAPHY HEAD AND NECK TECHNIQUE: Multidetector CT imaging of the head and neck was performed using the standard protocol during bolus administration of intravenous contrast. Multiplanar CT image reconstructions and  MIPs were obtained to evaluate the vascular anatomy. Carotid stenosis measurements (when applicable) are obtained utilizing NASCET criteria, using the distal internal carotid diameter as the denominator. RADIATION DOSE REDUCTION: This exam was performed according to the departmental dose-optimization program which includes automated exposure control, adjustment of the mA and/or kV according to patient size and/or use of iterative reconstruction technique. CONTRAST:  164m OMNIPAQUE IOHEXOL 350 MG/ML SOLN COMPARISON:  None Available. FINDINGS: CT HEAD FINDINGS Brain: No evidence of acute infarct, hemorrhage, mass, mass effect, or midline shift. No hydrocephalus or extra-axial fluid collection. Vascular: No hyperdense vessel. Skull: Normal. Negative for fracture or focal lesion. Sinuses/Orbits: No acute finding. Other: The mastoid air cells are well aerated. CTA NECK FINDINGS Evaluation is somewhat limited by bolus timing. Aortic arch: Standard branching. Imaged portion shows no evidence of aneurysm or dissection. No significant stenosis of the major arch vessel origins. Right carotid system: No evidence of dissection, occlusion, or hemodynamically significant stenosis (greater than 50%). Atherosclerotic disease at the bifurcation and in the proximal ICA is not hemodynamically significant. Left carotid system: No evidence of dissection, occlusion, or hemodynamically significant stenosis (greater than 50%). Vertebral arteries: No evidence of dissection, occlusion, or hemodynamically significant stenosis (greater than 50%). Skeleton: No acute osseous abnormality. Mild degenerative changes in the cervical spine. Other neck: Subcentimeter hypoattenuating lesions in the thyroid, for which no follow-up is indicated. (Reference: J Am Coll Radiol. 2015 Feb;12(2): 143-50) Upper chest: Negative. Review of the MIP images confirms the above findings CTA HEAD FINDINGS Evaluation is somewhat limited by bolus timing and venous  contamination. Anterior circulation: Both internal carotid arteries are patent to the termini, with moderate stenosis in the bilateral supraclinoid ICA. A1 segments patent.  Proximal A2 and A3 segments are patent. No M1 stenosis or occlusion.  M2 and proximal M3 segments. Posterior circulation: Vertebral arteries patent to the vertebrobasilar junction without stenosis. Posterior inferior cerebellar arteries patent proximally. Basilar patent to its distal aspect. Right SCA is patent proximally. Poor visualization of the left SCA. Patent P1 segments. P2 segments are perfused. Poor visualization of the P3 segments. The bilateral posterior communicating arteries are not visualized. Venous sinuses: As permitted by  contrast timing, patent. Anatomic variants: None significant. Review of the MIP images confirms the above findings IMPRESSION: 1.  No acute intracranial process. 2. Evaluation of the vasculature is limited by bolus timing. Within this limitation, no large vessel occlusion or hemodynamically significant stenosis in the head or neck. Electronically Signed   By: Merilyn Baba M.D.   On: 08/22/2021 17:59    Procedures Procedures    Medications Ordered in ED Medications  iohexol (OMNIPAQUE) 350 MG/ML injection 125 mL (125 mLs Intravenous Contrast Given 08/22/21 1720)    ED Course/ Medical Decision Making/ A&P                           Medical Decision Making Amount and/or Complexity of Data Reviewed Labs: ordered. Radiology: ordered.  Risk Prescription drug management. Decision regarding hospitalization.   51 y.o. male presents to the ED for concern of Weakness     This involves an extensive number of treatment options, and is a complaint that carries with it a high risk of complications and morbidity.  The emergent differential diagnosis prior to evaluation includes, but is not limited to:   This is not an exhaustive differential.   Past Medical History / Co-morbidities / Social  History: Hx of essential HTN, hyperlipidemia, DMT2 with neuropathy and non-insulin dependent, microalbuminuria, and erectile dysfunction Social Determinants of Health include: None  Additional History:  Internal and external records from outside source obtained and reviewed including: None  Lab Tests: I ordered, and personally interpreted labs.  The pertinent results include:   CBC: WC 3.9, RBC 4.01, Hgb 12.5, HCT 27.9 CMP/BMP: Glucose 130 UDS and UA pending  Imaging Studies: I ordered imaging studies including CTA head and dissection study.   I independently visualized and interpreted imaging which showed  CTA dissection study: Negative for dissection or PE CTA head: Negative for acute intracranial finding I agree with the radiologist interpretation.  Cardiac Monitoring: The patient was maintained on a cardiac monitor.  I personally viewed and interpreted the cardiac monitored which showed an underlying rhythm of: Sinus tachycardia I personally ordered and interpreted EKG 12 lead findings, which showed: abnormal ST changes in V2 leads, unable to rule out infarct at this time  ED Course / Critical Interventions: Pt well-appearing on exam.  Nontoxic, nonseptic appearing probably from presenting due to neurodeficits in the last 3 days. Initially began with some ataxia, right sided weakness, and discoordination days ago.  Progressed to facial asymmetry and vision changes.  Without chest pain, shortness of breath, dizziness, lightheadedness, syncope.  Blood glucose of 149.  Known Hx of DMT2, not on insulin, with prior associated neuropathy.  Sensation appears grossly intact.  Mild decreased LE pulses at baseline, difficult to evaluate for changes.  Also with some tenderness between his shoulder blades without recent injury or trauma on exam, suspicious for possible dissection.  Neck is supple, no meningismus.  Low initial suspicion for meningitis.  No aphasia, able to communicate clearly.   Though overall neurodeficits are concerning, is outside the 24-hour window.  Will continue with workup to assess for dissection.  Patient is not a candidate for thrombolytics.  Imaging pending. Upon reevaluation, patient is just returned from CT imaging.  Complaining of mild headache, otherwise status unchanged.  Neuroexam without significant changes. CT dissection study and CTA head and neck both negative for evidence of dissection, pulmonary embolus acute intracranial finding.  Overall etiology is unknown at this time, though strong suspicion.  Since pt's symptoms for >24 hours, may not be visible on CT imaging.  Plan for admission for further workup, teleneurology consultation placed. Consulted with Dr. Leonel Ramsay of neurology.  Discussed the patient case at length.  Concerned as well for possible stroke, though also acknowledges he is outside the 24-hour window and is not a candidate for thrombolytics.  Plans to consult with the patient, neurology ready to follow with plan for admission. Consulted with Dr. Denton Brick and discussed the above information.  Plan for admission.  Disposition: Admission.    I discussed this case with my attending, Dr. Sabra Heck, who agreed with the proposed treatment course and cosigned this note including patient's presenting symptoms, physical exam, and planned diagnostics and interventions.  Attending physician stated agreement with plan or made changes to plan which were implemented.     This chart was dictated using voice recognition software.  Despite best efforts to proofread, errors can occur which can change the documentation meaning.          Final Clinical Impression(s) / ED Diagnoses Final diagnoses:  Facial asymmetry  Weakness of right lower extremity  Weakness of right upper extremity  Ataxia  Essential hypertension, benign  Type 2 diabetes mellitus with diabetic neuropathy, without long-term current use of insulin ([place])  Hyperlipidemia,  unspecified hyperlipidemia type    Rx / DC Orders ED Discharge Orders     None         Candace Cruise [birth year] Yevette Edwards    Noemi Chapel, MD 08/25/21 [phone]

## 2021-08-22 NOTE — H&P (Addendum)
History and Physical    Bruce Mora YHC:[id number] DOB: 18-Mar-1970 DOA: 08/22/2021  PCP: [name]   Patient coming from: Home  I have personally briefly reviewed patient's old medical records in [place]  Chief Complaint: Right sided weakness  HPI: Bruce Mora is a 51 y.o. male with medical history significant for diabetes mellitus, hypertension. Patient presented to the ED with complaints of weakness, facial droop and limping while walking with pain between his shoulder blades tested yesterday morning at about 530. Family noticed that the right side of his face appeared to be droopy.  Patient denies any change in vision, no slowed or abnormal speech noted.  No prior strokes.  He does not take aspirin, reports allergy-of bleeding, but then he was also on a lot of Goody powders.  ED Course: Stable vitals.  Blood pressure 1 51-7 45 systolic.  EKG shows sinus tachycardia rate 104.  CTA head and neck without acute intracranial process, limited study, but no large vessel occlusion or hemodynamically significant stenosis in the head or neck. Due to reports of between his shoulder blades, CTA chest abdomen and pelvis- Negative for aortic dissection or other intra-abdominal pathology. Teleneurology consulted- stoke workup, admit here.  Review of Systems: As per HPI all other systems reviewed and negative.  Past Medical History:  Diagnosis Date   Atelectasis    chorine gas exposure, hospitalization   Diabetes mellitus    Diabetic neuropathy ([place])    Diabetic retinopathy    laser surgery - 2010   Dyslipidemia    ED (erectile dysfunction)    History of hepatitis B vaccination    Hypertension    Microalbuminuria    Refused pneumococcal vaccine 03/2012    Past Surgical History:  Procedure Laterality Date   CHOLECYSTECTOMY     COLONOSCOPY WITH PROPOFOL N/A 08/27/2020   Procedure: COLONOSCOPY WITH PROPOFOL;  Surgeon: Annamaria Helling, DO;   Location: [facility] ENDOSCOPY;  Service: Endoscopy;  Laterality: N/A;   ESOPHAGOGASTRODUODENOSCOPY (EGD) WITH PROPOFOL N/A 08/27/2020   Procedure: ESOPHAGOGASTRODUODENOSCOPY (EGD) WITH PROPOFOL;  Surgeon: Annamaria Helling, DO;  Location: [facility];  Service: Endoscopy;  Laterality: N/A;   HERNIA REPAIR     umbilical x 2   WISDOM TOOTH EXTRACTION       reports that he has never smoked. He has never used smokeless tobacco. He reports current alcohol use of about 12.0 standard drinks of alcohol per week. He reports that he does not use drugs.  Allergies  Allergen Reactions   Asa Buff (Mag [Buffered Aspirin] Other (See Comments)    GI upset   Glucophage [Metformin Hydrochloride] Rash    Rash?   Ibuprofen Other (See Comments)    Upset stomach   Lipitor [Atorvastatin Calcium] Other (See Comments)    Myalgias and arthralgias   Prilosec [Omeprazole Magnesium] Other (See Comments)    GI upset    Family History  Problem Relation Age of Onset   Diabetes Mother    Cancer Mother        breast   Arthritis Mother    Diabetes Father    Cancer Maternal Grandmother        died of lung cancer, hx/o breast cancer   Stroke Maternal Aunt    Hypertension Other        throughout both sides of family   Heart disease Neg Hx    Prior to Admission medications   Medication Sig Start Date End Date Taking? Authorizing  Provider  metFORMIN (GLUCOPHAGE-XR) 500 MG 24 hr tablet Take 500 mg by mouth 2 (two) times daily with a meal.   Yes [provider]  Semaglutide, 1 MG/DOSE, (OZEMPIC, 1 MG/DOSE,) 4 MG/3ML SOPN Inject 1 mg into the skin once a week. 02/04/21  Yes [provider]  sildenafil (VIAGRA) 50 MG tablet Take 50 mg by mouth daily as needed for erectile dysfunction.   Yes [provider]  tamsulosin (FLOMAX) 0.4 MG CAPS capsule Take 0.4 mg by mouth daily. 08/13/21  Yes [provider]    Physical Exam: Vitals:   08/22/21 1953 08/22/21 2000 08/22/21 2030  08/22/21 2100  BP: 135/85 139/88 (!) 145/108 (!) 138/95  Pulse: 88 91 98 98  Resp: 16 (!) 23 20 (!) 21  Temp:  98.2 F (36.8 C)    TempSrc:  Oral    SpO2: 99% 99% 99% 100%  Weight:      Height:        Constitutional: NAD, calm, comfortable Vitals:   08/22/21 1953 08/22/21 2000 08/22/21 2030 08/22/21 2100  BP: 135/85 139/88 (!) 145/108 (!) 138/95  Pulse: 88 91 98 98  Resp: 16 (!) 23 20 (!) 21  Temp:  98.2 F (36.8 C)    TempSrc:  Oral    SpO2: 99% 99% 99% 100%  Weight:      Height:       Eyes: PERRL, lids and conjunctivae normal ENMT: Mucous membranes are moist.  Neck: normal, supple, no masses, no thyromegaly Respiratory: clear to auscultation bilaterally, no wheezing, no crackles. Normal respiratory effort. No accessory muscle use.  Cardiovascular: Regular rate and rhythm, no murmurs / rubs / gallops. No extremity edema. 2+ pedal pulses. No carotid bruits.  Abdomen: no tenderness, no masses palpated. No hepatosplenomegaly. Bowel sounds positive.  Musculoskeletal: no clubbing / cyanosis. No joint deformity upper and lower extremities. Good ROM, no contractures. Normal muscle tone.  Skin: no rashes, lesions, ulcers. No induration  Neurological:     Mental Status: he is alert.     GCS: GCS eye subscore is 4. GCS verbal subscore is 5. GCS motor subscore is 6.     Comments: Mental Status:  Alert, oriented, thought content appropriate, able to give a coherent history. Speech fluent without evidence of aphasia. Able to follow 2 step commands without difficulty.  Cranial Nerves:  II:  Peripheral visual fields grossly normal, pupils equal, round, reactive to light III,IV, VI: ptosis not present, extra-ocular motions intact bilaterally  V,VII: At rest there is a slight droop to his right face, but this disappears when smiling, eyebrows raise symmetric, VIII: hearing grossly normal to voice  X:  XI: bilateral shoulder shrug symmetric and strong XII: midline tongue extension without  fassiculations Motor:  Normal tone.  Right upper and lower extremity, 4/5 strength.  Left upper and lower extremity 5/5 strength Sensory: Sensation intact to light touch in all extremities.  Cerebellar:   CV: distal pulses palpable throughout    Psychiatric: Normal judgment and insight. Alert and oriented x 3. Normal mood.   Labs on Admission: I have personally reviewed following labs and imaging studies  CBC: Recent Labs  Lab 08/22/21 1538  WBC 3.9*  HGB 12.5*  HCT 37.9*  MCV 94.5  PLT 633   Basic Metabolic Panel: Recent Labs  Lab 08/22/21 1538 08/22/21 1646  NA 136 136  K 4.3 4.3  CL 103 102  CO2 27 27  GLUCOSE 138* 136*  BUN 11 11  CREATININE  0.76 0.84  CALCIUM 8.8* 8.8*  PHOS  --  3.3   GFR: Estimated Creatinine Clearance: 106.2 mL/min (by C-G formula based on SCr of 0.84 mg/dL). Liver Function Tests: Recent Labs  Lab 08/22/21 1646 08/22/21 1753  AST  --  17  ALT  --  18  ALKPHOS  --  44  BILITOT  --  0.6  PROT  --  6.2*  ALBUMIN 3.8 3.4*   Coagulation Profile: Recent Labs  Lab 08/22/21 1620  INR 1.0   CBG: Recent Labs  Lab 08/22/21 1450  GLUCAP 149*   Urine analysis:    Component Value Date/Time   BILIRUBINUR neg 04/06/2012 0858   PROTEINUR small 04/06/2012 0858   UROBILINOGEN negative 04/06/2012 0858   NITRITE neg 04/06/2012 0858   LEUKOCYTESUR Negative 04/06/2012 0858    Radiological Exams on Admission: CT Angio Chest/Abd/Pel for Dissection W and/or W/WO  Result Date: 08/22/2021 CLINICAL DATA:  Back pain, weakness, facial droop EXAM: CT ANGIOGRAPHY CHEST, ABDOMEN AND PELVIS TECHNIQUE: Non-contrast CT of the chest was initially obtained. Multidetector CT imaging through the chest, abdomen and pelvis was performed using the standard protocol during bolus administration of intravenous contrast. Multiplanar reconstructed images and MIPs were obtained and reviewed to evaluate the vascular anatomy. RADIATION DOSE REDUCTION: This exam was  performed according to the departmental dose-optimization program which includes automated exposure control, adjustment of the mA and/or kV according to patient size and/or use of iterative reconstruction technique. CONTRAST:  14m OMNIPAQUE IOHEXOL 350 MG/ML SOLN COMPARISON:  None Available. FINDINGS: CTA CHEST FINDINGS Cardiovascular: There is no demonstrable mural hematoma in the thoracic aorta in the noncontrast images. Scattered coronary artery calcifications are seen. There is homogeneous enhancement in thoracic aorta. There is no demonstrable intimal flap. Contrast density in pulmonary artery branches is less than optimal. As far as seen, there is no evidence of any large central pulmonary embolism in the main pulmonary arteries. Mediastinum/Nodes: No significant lymphadenopathy is seen. Lungs/Pleura: There is no focal pulmonary consolidation. There is no pleural effusion or pneumothorax. Musculoskeletal: No acute findings are seen in bony structures in thorax. Review of the MIP images confirms the above findings. CTA ABDOMEN AND PELVIS FINDINGS VASCULAR Aorta: Normal. There is no evidence of dissection or focal aneurysmal dilation. Celiac: Normal SMA: Normal Renals: Normal IMA: Proximal course of IMA is patent. Inflow: There are atherosclerotic plaques and calcifications in iliac arteries without dissection or stenosis or aneurysmal dilation. Veins: Unremarkable. Review of the MIP images confirms the above findings. NON-VASCULAR Hepatobiliary: There is fatty infiltration liver. Surgical clips are seen in gallbladder fossa. There is no significant dilation of bile ducts. Pancreas: No focal abnormalities are seen. Spleen: Unremarkable. Adrenals/Urinary Tract: Adrenals are not enlarged. There is no hydronephrosis. There are no renal or ureteral stones. Urinary bladder is unremarkable. Stomach/Bowel: Stomach is distended, possibly due to recent meal. Small bowel loops are not dilated. Appendix is not dilated.  There is no pericecal inflammation. There is no significant focal wall thickening in colon. There is mild diffuse wall thickening in transverse colon. There is no pericolic stranding. Lymphatic: Unremarkable. Reproductive: There are small calcifications in the prostate. Other: There is no ascites or pneumoperitoneum. Musculoskeletal: No acute findings are seen. Review of the MIP images confirms the above findings. IMPRESSION: There is no evidence of dissection in the thoracic and abdominal aorta. Major branches of thoracic and abdominal aorta are patent. There is no evidence of central pulmonary artery embolism. Scattered coronary artery calcifications are seen. There is  no focal pulmonary consolidation. There is no evidence of intestinal obstruction or pneumoperitoneum. There is no hydronephrosis. Appendix is not dilated. There is mild diffuse wall thickening and transverse colon which may be an artifact due to incomplete distention all suggest chronic nonspecific colitis. There is no pericolic stranding or fluid collection. Electronically Signed   By: Elmer Picker M.D.   On: 08/22/2021 18:10   CT ANGIO HEAD NECK W WO CM  Result Date: 08/22/2021 CLINICAL DATA:  Weakness, facial droop, limp EXAM: CT ANGIOGRAPHY HEAD AND NECK TECHNIQUE: Multidetector CT imaging of the head and neck was performed using the standard protocol during bolus administration of intravenous contrast. Multiplanar CT image reconstructions and MIPs were obtained to evaluate the vascular anatomy. Carotid stenosis measurements (when applicable) are obtained utilizing NASCET criteria, using the distal internal carotid diameter as the denominator. RADIATION DOSE REDUCTION: This exam was performed according to the departmental dose-optimization program which includes automated exposure control, adjustment of the mA and/or kV according to patient size and/or use of iterative reconstruction technique. CONTRAST:  144m OMNIPAQUE IOHEXOL 350  MG/ML SOLN COMPARISON:  None Available. FINDINGS: CT HEAD FINDINGS Brain: No evidence of acute infarct, hemorrhage, mass, mass effect, or midline shift. No hydrocephalus or extra-axial fluid collection. Vascular: No hyperdense vessel. Skull: Normal. Negative for fracture or focal lesion. Sinuses/Orbits: No acute finding. Other: The mastoid air cells are well aerated. CTA NECK FINDINGS Evaluation is somewhat limited by bolus timing. Aortic arch: Standard branching. Imaged portion shows no evidence of aneurysm or dissection. No significant stenosis of the major arch vessel origins. Right carotid system: No evidence of dissection, occlusion, or hemodynamically significant stenosis (greater than 50%). Atherosclerotic disease at the bifurcation and in the proximal ICA is not hemodynamically significant. Left carotid system: No evidence of dissection, occlusion, or hemodynamically significant stenosis (greater than 50%). Vertebral arteries: No evidence of dissection, occlusion, or hemodynamically significant stenosis (greater than 50%). Skeleton: No acute osseous abnormality. Mild degenerative changes in the cervical spine. Other neck: Subcentimeter hypoattenuating lesions in the thyroid, for which no follow-up is indicated. (Reference: J Am Coll Radiol. 2015 Feb;12(2): 143-50) Upper chest: Negative. Review of the MIP images confirms the above findings CTA HEAD FINDINGS Evaluation is somewhat limited by bolus timing and venous contamination. Anterior circulation: Both internal carotid arteries are patent to the termini, with moderate stenosis in the bilateral supraclinoid ICA. A1 segments patent.  Proximal A2 and A3 segments are patent. No M1 stenosis or occlusion.  M2 and proximal M3 segments. Posterior circulation: Vertebral arteries patent to the vertebrobasilar junction without stenosis. Posterior inferior cerebellar arteries patent proximally. Basilar patent to its distal aspect. Right SCA is patent proximally. Poor  visualization of the left SCA. Patent P1 segments. P2 segments are perfused. Poor visualization of the P3 segments. The bilateral posterior communicating arteries are not visualized. Venous sinuses: As permitted by contrast timing, patent. Anatomic variants: None significant. Review of the MIP images confirms the above findings IMPRESSION: 1.  No acute intracranial process. 2. Evaluation of the vasculature is limited by bolus timing. Within this limitation, no large vessel occlusion or hemodynamically significant stenosis in the head or neck. Electronically Signed   By: AMerilyn BabaM.D.   On: 08/22/2021 17:59    EKG: Independently reviewed.  Sinus tachycardia rate 104.  QTc 428.  No significant change from prior.  Assessment/Plan Principal Problem:   Right sided weakness Active Problems:   Type 2 diabetes mellitus with diabetic neuropathy (HCC)   Essential hypertension, benign  Assessment and Plan: * Right sided weakness Objectively, right-sided weakness- 4/5 with slight right facial droop at rest that disappears with smiling.  No other focal neurologic deficits.  No history of strokes.  Not on aspirin.  For now he has declined aspirin due to history of bleeding, but then he was also on Goody powders.   -CTA head and neck without acute abnormality. -Telemetry neurology recommends full stroke workup to include MRI brain MRA head, Plavix 75 mg after 300 mg load, at aspirin if he is able to tolerate. -Echocardiogram -PT/ OT eval - HgbA1c, lipid panel in a.m -Allergy to atorvastatin arthralgias and myalgias, pending lipid panel and MRI findings, consider low intensity statin instead    Essential hypertension, benign Systolic ranging from 282-060.  Reports she was previously on antihypertensives, at this was discontinued about a year ago.  Type 2 diabetes mellitus with diabetic neuropathy (HCC) Random glucose 138 - HgbA1c -Hold metformin and Ozempic    DVT prophylaxis: Lovenox Code  Status: Full Family Communication: Daughter and Aunt at bedside. Disposition Plan: ~ 1 -2 days Consults called: neurology Admission status:   obs tele    Author: Bethena Roys, MD 08/22/2021 9:52 PM  For on call review [email or web address].

## 2021-08-22 NOTE — ED Provider Notes (Signed)
Medical screening examination/treatment/procedure(s) were conducted as a shared visit with non-physician practitioner(s) and myself.  I personally evaluated the patient during the encounter.  Clinical Impression:   Final diagnoses:  None    This patient is a 51 year old male presenting to the hospital with a complaint of weakness.  It appears that this is been gradual over the last couple of days that he has noticed that his right upper extremity has been weaker and has had some difficulty with ambulation and coordination.  He has never had a stroke and in fact he states that he is a diabetic, but has no hypertension, he has no high cholesterol, he takes Trulicity and insulin as well as pioglitazone and metformin.  He reports having no headache no changes in vision but has had some difficulty with facial droop.  Family member at the bedside corroborates the story.  On my exam the patient is borderline tachycardic and what appears to be irregular rhythm, he is afebrile with a normal blood pressure of 115/75 but has obvious pronator drift on the right with difficulty with coordination and dysmetria of the right upper extremity.  His speech is clear and organized, he has a subtle right-sided facial droop, his right arm and right leg have weakness at the major muscle groups in the grip.  The patient does have some discomfort in his shoulders as well raising concern for possible aortic dissection in addition to his other sources of cerebrovascular accident.  Will need CT scan of the brain as well as a CT dissection protocol study and admission to the hospital for what appears to be a stroke.  His symptoms started well over 24 hours ago putting him out of the window for TNK therapy   EKG Interpretation  Date/Time:  Thursday August 22 2021 15:03:28 EDT Ventricular Rate:  104 PR Interval:  142 QRS Duration: 82 QT Interval:  326 QTC Calculation: 428 R Axis:   79 Text Interpretation: Sinus tachycardia  Cannot rule out Anterior infarct , age undetermined Abnormal ECG No previous ECGs available ST elevation ikn the V2 lead alone - no old EKG Confirmed by Noemi Chapel [phone]) on 08/22/2021 3:08:02 PM          Noemi Chapel, MD 08/25/21 [phone]

## 2021-08-22 NOTE — Assessment & Plan Note (Addendum)
Objectively, right-sided weakness- 4/5 with slight right facial droop at rest that disappears with smiling.  No other focal neurologic deficits.  No history of strokes.  Not on aspirin.  For now he has declined aspirin due to history of bleeding, but then he was also on Goody powders.   -CTA head and neck without acute abnormality. -Telemetry neurology recommends full stroke workup to include MRI brain MRA head, Plavix 75 mg after 300 mg load, at aspirin if he is able to tolerate. -Echocardiogram -PT/ OT eval - HgbA1c, lipid panel in a.m -Allergy to atorvastatin arthralgias and myalgias, pending lipid panel and MRI findings, consider low intensity statin instead

## 2021-08-22 NOTE — Consult Note (Signed)
[facility] Telemedicine Consult   Requesting Provider: Hazle Nordmann Consult Participants: PAtient, family, bedisde nurse  This consult was provided via telemedicine with 2-way video and audio communication. The patient/family was informed that care would be provided in this way and agreed to receive care in this manner.    Chief Complaint: Right-sided weakness  HPI: 51 year old male who presents with right-sided weakness that he first noticed yesterday morning when he woke up around 5:30 AM.  He states that it may have gotten slightly worse since then.  He describes slight numbness as well.  He denies visual change, nausea, vomiting, vertigo, difficulty swallowing.  Due to the symptoms not improving he saw care in the emergency department where a CT/CTA was performed.  The CTA was suboptimal, but no definite acute findings in the CT.    LKW: 8/8 prior to bed tpa given?: No, outside of window IR Thrombectomy? No, no signs of LVO Time of teleneurologist evaluation: 7 PM  Exam: Vitals:   08/22/21 1610 08/22/21 1700  BP: 137/85 136/83  Pulse: 91 83  Resp: 12 16  Temp:    SpO2: 100% 100%    General: in bed, NAD  1A: Level of Consciousness - 0 1B: Ask Month and Age - 0 1C: 'Blink Eyes' & 'Squeeze Hands' - 0 2: Test Horizontal Extraocular Movements - 0 3: Test Visual Fields - 0 4: Test Facial Palsy - 1(though he appears to hold the right face taut when puffing cheeks) 5A: Test Left Arm Motor Drift - 0 5B: Test Right Arm Motor Drift - 1(downward without clear pronation) 6A: Test Left Leg Motor Drift - 0 6B: Test Right Leg Motor Drift - 1 7: Test Limb Ataxia - 1(right arm dysmetria) 8: Test Sensation - 1 reduced on right 9: Test Language/Aphasia- 0 10: Test Dysarthria - 0 11: Test Extinction/Inattention - 0 NIHSS score: 5   Imaging Reviewed: CT head - negative, though I do question an age-indeterminate subcortical infarct on the left CTA-decent evaluation of neck,  but very poor intracranial evaluation  Labs reviewed in epic and pertinent values follow: Creatinine 0.84   Assessment: 51 year old male with right-sided weakness with a history of diabetes and hypertension.  Given these risk factors and the symptoms, I think that acute ischemic stroke is by far the most likely culprit.  He will need to be admitted for secondary risk factor modification and physical therapy evaluations.  Recommendations:  - HgbA1c, fasting lipid panel - MRI of the brain without contrast, MRA head, no need for neck - Frequent neuro checks - Echocardiogram - Prophylactic therapy-Antiplatelet med: plavix '75mg'$  daily  after '300mg'$  load, if he is able to tolerate aspirin, would add 81 mg daily for 3 weeks - Risk factor modification - Telemetry monitoring - PT consult, OT consult, Speech consult  Roland Rack, MD [facility] [phone]  If 7pm- 7am, please page neurology on call as listed in [place].

## 2021-08-22 NOTE — Assessment & Plan Note (Signed)
Systolic ranging from 525-894.  Reports she was previously on antihypertensives, at this was discontinued about a year ago.

## 2021-08-22 NOTE — ED Provider Triage Note (Deleted)
Emergency Medicine Provider Triage Evaluation Note  Bruce Mora , a 51 y.o. male  was evaluated in triage.  Pt complains of weakness and facial changes/swelling since yesterday.  Family member came home around 1 PM, noticed that the right side of his face appeared drooping and left side of his face appeared to be mildly swollen.  Patient endorses feeling overall weak over the last few days, but possibly more on the right side than the left.  States has been having difficulty walking, stating he "feels off balance".  Hx of not insulin-dependent DMT2 with neuropathy.  Also Hx of essential HTN, hyperlipidemia, erectile dysfunction.  Figured his symptoms were due to "old age".  Review of Systems  Positive:  Negative: See above  Physical Exam  BP 115/75   Pulse 100   Temp 98 F (36.7 C) (Oral)   Resp 20   Ht '5\' 10"'$  (1.778 m)   Wt 74.8 kg   SpO2 100%   BMI 23.68 kg/m  Gen:   Awake, no distress   Resp:  Normal effort, CTAB Neuro:   No significant facial asymmetry or swelling.  Mild weakness of lower extremities and right upper extremity.  Abnormal EOMs, unable to follow finger due to "losing sight of it/being unable to focus".  No apparent visual field deficits, able to count fingers.  Sensation appears grossly intact of all extremities.  No aphasia or dysarthria.  No tongue deviation. Other:  Mild tenderness between shoulder blades.  Abdomen soft nontender.  Radial and DP pulses 2+ bilaterally.  Medical Decision Making  Medically screening exam initiated at 3:25 PM.  Appropriate orders placed.  Bruce Mora was informed that the remainder of the evaluation will be completed by another provider, this initial triage assessment does not replace that evaluation, and the importance of remaining in the ED until their evaluation is complete.  Patient has been experiencing symptoms for over 24 hours, is outside the acuity window for possible stroke.   Bruce Rome, PA-C 67/12/45  [id number]    Bruce Rome, PA-C 83/38/25 1539

## 2021-08-23 ENCOUNTER — Observation Stay (HOSPITAL_COMMUNITY): Payer: BC Managed Care – PPO

## 2021-08-23 ENCOUNTER — Observation Stay (HOSPITAL_BASED_OUTPATIENT_CLINIC_OR_DEPARTMENT_OTHER): Payer: BC Managed Care – PPO

## 2021-08-23 DIAGNOSIS — I6389 Other cerebral infarction: Secondary | ICD-10-CM

## 2021-08-23 DIAGNOSIS — I639 Cerebral infarction, unspecified: Secondary | ICD-10-CM | POA: Diagnosis not present

## 2021-08-23 DIAGNOSIS — R531 Weakness: Secondary | ICD-10-CM | POA: Diagnosis not present

## 2021-08-23 LAB — LIPID PANEL
Cholesterol: 157 mg/dL (ref 0–200)
HDL: 60 mg/dL (ref >40)
LDL Cholesterol: 84 mg/dL (ref 0–99)
Total CHOL/HDL Ratio: 2.6 RATIO
Triglycerides: 67 mg/dL (ref <150)
VLDL: 13 mg/dL (ref 0–40)

## 2021-08-23 LAB — ECHOCARDIOGRAM COMPLETE
AR max vel: 2.96 cm2
AV Area VTI: 2.71 cm2
AV Area mean vel: 2.69 cm2
AV Mean grad: 3 mmHg
AV Peak grad: 5.6 mmHg
Ao pk vel: 1.18 m/s
Area-P 1/2: 3.72 cm2
Calc EF: 49.5 %
Height: 70 in
MV VTI: 2.57 cm2
S' Lateral: 3.9 cm
Single Plane A2C EF: 52.3 %
Single Plane A4C EF: 46.5 %
Weight: 2640 oz

## 2021-08-23 LAB — HEMOGLOBIN A1C
Hgb A1c MFr Bld: 6.1 % — ABNORMAL HIGH (ref 4.8–5.6)
Mean Plasma Glucose: 128.37 mg/dL

## 2021-08-23 LAB — GLUCOSE, CAPILLARY
Glucose-Capillary: 123 mg/dL — ABNORMAL HIGH (ref 70–99)
Glucose-Capillary: 106 mg/dL — ABNORMAL HIGH (ref 70–99)
Glucose-Capillary: 147 mg/dL — ABNORMAL HIGH (ref 70–99)

## 2021-08-23 LAB — HIV ANTIBODY (ROUTINE TESTING W REFLEX): HIV Screen 4th Generation wRfx: NONREACTIVE

## 2021-08-23 MED ORDER — ATORVASTATIN CALCIUM 20 MG PO TABS: 20.0000 mg | ORAL_TABLET | Freq: Every day | ORAL | 11 refills | Status: AC

## 2021-08-23 MED ORDER — PANTOPRAZOLE SODIUM 40 MG PO TBEC: 40.0000 mg | DELAYED_RELEASE_TABLET | Freq: Every day | ORAL | 5 refills | Status: AC

## 2021-08-23 MED ORDER — CLOPIDOGREL BISULFATE 75 MG PO TABS: 75.0000 mg | ORAL_TABLET | Freq: Every day | ORAL | 3 refills | Status: AC

## 2021-08-23 MED ORDER — ASPIRIN 81 MG PO TBEC: 81.0000 mg | DELAYED_RELEASE_TABLET | Freq: Every day | ORAL | 11 refills | Status: AC

## 2021-08-23 NOTE — Plan of Care (Signed)
  Problem: Acute Rehab PT Goals(only PT should resolve) Goal: Pt Will Ambulate Outcome: Progressing Flowsheets (Taken 08/23/2021 0841) Pt will Ambulate:  > 125 feet  with modified independence  with supervision  with least restrictive assistive device Goal: Pt/caregiver will Perform Home Exercise Program Outcome: Progressing Flowsheets (Taken 08/23/2021 0841) Pt/caregiver will Perform Home Exercise Program:  For increased strengthening  For improved balance  Independently  8:42 AM, 08/23/21 Mearl Latin PT, DPT Physical Therapist at [facility]

## 2021-08-23 NOTE — TOC Transition Note (Signed)
Transition of Care [facility]) - CM/SW Discharge Note   Patient Details  Name: Bruce Mora MRN: [id number] Date of Birth: Sep 29, 1970  Transition of Care [facility]) CM/SW Contact:  Iona Beard, [place] Phone Number: 08/23/2021, 11:07 AM   Clinical Narrative:    CSW noted per chart review that PT/OT is recommending outpatient PT and OT for pt at D/C. CSW spoke with pt about this. Pt is agreeable to outpatient referrals. CSW made outpatient PT and OT referrals, they will reach out to pt to set up appointments. TOC signing off.   Final next level of care: OP Rehab Barriers to Discharge: No Barriers Identified   Patient Goals and CMS Choice Patient states their goals for this hospitalization and ongoing recovery are:: Outpatient PT and OT CMS Medicare.gov Compare Post Acute Care list provided to:: Patient Choice offered to / list presented to : Patient  Discharge Placement                       Discharge Plan and Services                                     Social Determinants of Health (SDOH) Interventions     Readmission Risk Interventions     No data to display

## 2021-08-23 NOTE — Progress Notes (Signed)
I connected with  Bruce Mora on 08/23/21 by a video enabled telemedicine application and verified that I am speaking with the correct person using two identifiers.   I discussed the limitations of evaluation and management by telemedicine. The patient expressed understanding and agreed to proceed.  Location of patient: [facility] Location of physician: [facility]  Subjective: No acute events overnight.  Denies any new concerns.  ROS: negative except above  Examination  Vital signs in last 24 hours: Temp:  [98 F (36.7 C)-98.7 F (37.1 C)] 98.7 F (37.1 C) (08/11 0454) Pulse Rate:  [83-100] 87 (08/11 0454) Resp:  [12-23] 16 (08/11 0454) BP: (115-149)/(70-108) 149/90 (08/11 0454) SpO2:  [98 %-100 %] 99 % (08/11 0454) FiO2 (%):  [21 %] 21 % (08/10 2357) Weight:  [74.8 kg] 74.8 kg (08/10 1453)  General: lying in bed, NAD Neuro: AOx3, no aphasia, cranial nerves II to XII appear grossly intact, antigravity strength in all 4 extremities with subtle drift in right upper and lower extremity, FTN intact, normal sensation to light touch in all 4 extremities  Basic Metabolic Panel: Recent Labs  Lab 08/22/21 1538 08/22/21 1646  NA 136 136  K 4.3 4.3  CL 103 102  CO2 27 27  GLUCOSE 138* 136*  BUN 11 11  CREATININE 0.76 0.84  CALCIUM 8.8* 8.8*  PHOS  --  3.3    CBC: Recent Labs  Lab 08/22/21 1538  WBC 3.9*  HGB 12.5*  HCT 37.9*  MCV 94.5  PLT 218     Coagulation Studies: Recent Labs    08/22/21 1620  LABPROT 12.6  INR 1.0    Imaging MRI brain without contrast and MR angio head without contrast 08/23/2021: 1. Acute infarct in the left pons. Mild associated without mass effect. 2. Age-advanced T2/FLAIR hyperintensities within the white matter, which probably represent the sequela of accelerated chronic microvascular ischemic disease given the patient's risk factors. 3. Remote left corona radiata, right basal ganglia, pontine and cerebellar  lacunar infarcts. 4. No emergent large vessel occlusion or proximal hemodynamically significant stenosis.  CT head without contrast and CTA head and neck with and without contrast 08/22/2021: 1.  No acute intracranial process. 2. Evaluation of the vasculature is limited by bolus timing. Within this limitation, no large vessel occlusion or hemodynamically significant stenosis in the head or neck.      ASSESSMENT AND PLAN: 51 year old male who presented with right-sided weakness and MRI brain showed acute ischemic stroke in the left pons.  Acute ischemic stroke, left pons Chronic strokes Hyperlipidemia -Etiology: Likely small vessel disease versus embolic  Recommendations -Recommend Plavix 75 mg daily -Patient states he had hematemesis and abdominal pain when he was taking aspirin for more than 20 years ago. He is willing to try it again.  Therefore recommend aspirin 81 mg daily for 3 weeks. -Recommend atorvastatin 40 mg daily for secondary stroke prevention -TTE ordered and pending.  If normal, recommend 30-day event monitor at the time of discharge -PT/OT -Goal blood pressure: Normotensive as patient is outside 48-hour permissive hypertension window -Stroke education, modification of stroke risk factors -Recommend follow-up with neurology in 3 months -Discussed findings with Dr. Denton Brick and patient at bedside  I have spent a total of 36   minutes with the patient reviewing hospital notes,  test results, labs and examining the patient as well as establishing an assessment and plan that was discussed personally with the patient.  > 50% of time was spent in direct  patient care.     Zeb Comfort Epilepsy [facility] For questions after 5pm please refer to AMION to reach the Neurologist on call

## 2021-08-23 NOTE — Progress Notes (Signed)
OT Cancellation Note  Patient Details Name: Bruce Mora MRN: [id number] DOB: 05/01/1970   Cancelled Treatment:    Reason Eval/Treat Not Completed: Patient at procedure or test/ unavailable. Pt being taken to MRI when evaluation attempted. Will attempt to evaluate pt later as time permits.   Winnie Barsky OT, MOT   Larey Seat 08/23/2021, 9:01 AM

## 2021-08-23 NOTE — Progress Notes (Signed)
*  PRELIMINARY RESULTS* Echocardiogram 2D Echocardiogram has been performed.  Bruce Mora 08/23/2021, 10:15 AM

## 2021-08-23 NOTE — Evaluation (Signed)
Occupational Therapy Evaluation Patient Details Name: Bruce Mora MRN: [id number] DOB: 10-Nov-1970 Today's Date: 08/23/2021   History of Present Illness Bruce Mora is a 51 y.o. male with medical history significant for diabetes mellitus, hypertension.  Patient presented to the ED with complaints of weakness, facial droop and limping while walking with pain between his shoulder blades tested yesterday morning at about 530.  Family noticed that the right side of his face appeared to be droopy.  Patient denies any change in vision, no slowed or abnormal speech noted.  No prior strokes.  He does not take aspirin, reports allergy-of bleeding, but then he was also on a lot of Goody powders.   Clinical Impression   Pt agreeable to OT evaluation. Pt is independent at baseline per chart review. Pt reports difficulty with fine motor task of opening packets for breakfast. Pt appears to have novel weakness in R UE and decreased fine and gross motor skills in R UE. Pt is mildly unsteady in standing leaning on nearby equipment. Pt would benefit from outpatient OT services to improve R UE strength and coordination. Further vision evaluation is recommended due to pt's difficulty with visual tracking today. Pt is not recommended for further acute OT services and will be discharged to care of nursing staff for remaining length of stay.      Recommendations for follow up therapy are one component of a multi-disciplinary discharge planning process, led by the attending physician.  Recommendations may be updated based on patient status, additional functional criteria and insurance authorization.   Follow Up Recommendations  Outpatient OT    Assistance Recommended at Discharge Set up Supervision/Assistance  Patient can return home with the following A little help with walking and/or transfers;A little help with bathing/dressing/bathroom;Assist for transportation;Help with stairs or ramp for  entrance;Assistance with cooking/housework    Functional Status Assessment  Patient has had a recent decline in their functional status and demonstrates the ability to make significant improvements in function in a reasonable and predictable amount of time.  Equipment Recommendations  None recommended by OT    Recommendations for Other Services Other (comment) (Evaluation from vision specialist to ensure no novel deficits.)     Precautions / Restrictions Precautions Precautions: Fall Restrictions Weight Bearing Restrictions: No      Mobility Bed Mobility Overal bed mobility: Modified Independent             General bed mobility comments: slighlty slow    Transfers Overall transfer level: Modified independent Equipment used: None               General transfer comment: Leaning on bed rail briefly. Somewhat unsteady.      Balance Overall balance assessment: Needs assistance Sitting-balance support: No upper extremity supported, Feet supported Sitting balance-Leahy Scale: Good Sitting balance - Comments: seated in recliner   Standing balance support: Single extremity supported, During functional activity Standing balance-Leahy Scale: Fair Standing balance comment: leaning on bed to L side breifly.                           ADL either performed or assessed with clinical judgement   ADL Overall ADL's : Modified independent (Pt able to doff and dong sock seated in chair without issue.)  Vision Baseline Vision/History: 1 Wears glasses Ability to See in Adequate Light: 1 Impaired Patient Visual Report: No change from baseline Vision Assessment?: Yes Tracking/Visual Pursuits: Other (comment) (Slow tracking with moderate revert to midline from L and R tracking. Pt unsure if the task was difficult.) Visual Fields: No apparent deficits Additional Comments: Pt may benefit from further visual  testing to ensure no deficits.                Pertinent Vitals/Pain Pain Assessment Pain Assessment: 0-10 Pain Score: 5  Pain Location: bilateral shoulders (more in L shoulder) Pain Descriptors / Indicators: Sharp Pain Intervention(s): Limited activity within patient's tolerance, Monitored during session, Repositioned     Hand Dominance Right   Extremity/Trunk Assessment Upper Extremity Assessment Upper Extremity Assessment: RUE deficits/detail RUE Deficits / Details: 3-/5 MMT shoulder; 4+/5 elbow flexion; 4-/5 elbow extension. Slow sequential finger touching as well as reports of difficulty opening packets for breakfast. RUE Sensation: WNL RUE Coordination: decreased fine motor;decreased gross motor   Lower Extremity Assessment Lower Extremity Assessment: Defer to PT evaluation RLE Deficits / Details: grossly 4/5 RLE Sensation: WNL RLE Coordination: decreased gross motor   Cervical / Trunk Assessment Cervical / Trunk Assessment: Normal   Communication Communication Communication: No difficulties   Cognition Arousal/Alertness: Awake/alert Behavior During Therapy: WFL for tasks assessed/performed Overall Cognitive Status: Within Functional Limits for tasks assessed                                                        Home Living Family/patient expects to be discharged to:: Private residence Living Arrangements: Spouse/significant other   Type of Home: Mobile home Home Access: Stairs to enter [employer] of Steps: 5 Entrance Stairs-Rails: Right;Left;Can reach both Home Layout: One level     Bathroom Shower/Tub: Tub/shower unit;Walk-in shower   Bathroom Toilet: Standard Bathroom Accessibility: Yes   Home Equipment: Conservation officer, nature (2 wheels);Cane - single point   Additional Comments: taken via PT note      Prior Functioning/Environment Prior Level of Function : Independent/Modified Independent;Working/employed;Driving              Mobility Comments: states community ambulation without AD ADLs Comments: independent         AM-PAC OT "6 Clicks" Daily Activity     Outcome Measure Help from another person eating meals?: None Help from another person taking care of personal grooming?: None Help from another person toileting, which includes using toliet, bedpan, or urinal?: None Help from another person bathing (including washing, rinsing, drying)?: A Little Help from another person to put on and taking off regular upper body clothing?: None Help from another person to put on and taking off regular lower body clothing?: None 6 Click Score: 23   End of Session    Activity Tolerance: Patient tolerated treatment well Patient left: in bed;Other (comment) (Left with person from echocardiogram team.)  OT Visit Diagnosis: Unsteadiness on feet (R26.81);Other abnormalities of gait and mobility (R26.89);Muscle weakness (generalized) (M62.81)                Time: 0254-2706 OT Time Calculation (min): 17 min Charges:  OT General Charges $OT Visit: 1 Visit OT Evaluation $OT Eval Low Complexity: 1 Low  Gentri Guardado OT, MOT  Larey Seat 08/23/2021, 9:46 AM

## 2021-08-23 NOTE — Evaluation (Signed)
Physical Therapy Evaluation Patient Details Name: Bruce Mora MRN: [id number] DOB: Apr 20, 1970 Today's Date: 08/23/2021  History of Present Illness  Bruce Mora is a 51 y.o. male with medical history significant for diabetes mellitus, hypertension.  Patient presented to the ED with complaints of weakness, facial droop and limping while walking with pain between his shoulder blades tested yesterday morning at about 530.  Family noticed that the right side of his face appeared to be droopy.  Patient denies any change in vision, no slowed or abnormal speech noted.  No prior strokes.  He does not take aspirin, reports allergy-of bleeding, but then he was also on a lot of Goody powders.   Clinical Impression  Patient limited for functional mobility as stated below secondary to RLE weakness, impaired coordination and impaired balance. Patient with decreased RLE strength, grossly 4/5 throughout, and sensation WFL. Patient does not require assist for bed mobility or transfer but is slightly unsteady upon standing and reaches for IV pole for support. He ambulates with IV pole with decreased RLE stance time and impaired coordination. Patient returns to room at end of session and educated him on using RW or [facility] upon return home to improve balance and reduce risk of falling. Patient will benefit from continued physical therapy in hospital and recommended venue below to increase strength, balance, endurance for safe ADLs and gait.        Recommendations for follow up therapy are one component of a multi-disciplinary discharge planning process, led by the attending physician.  Recommendations may be updated based on patient status, additional functional criteria and insurance authorization.  Follow Up Recommendations Outpatient PT      Assistance Recommended at Discharge Intermittent Supervision/Assistance  Patient can return home with the following  A little help with walking and/or transfers;A  little help with bathing/dressing/bathroom;Assistance with cooking/housework;Assist for transportation;Help with stairs or ramp for entrance    Equipment Recommendations None recommended by PT  Recommendations for Other Services       Functional Status Assessment Patient has had a recent decline in their functional status and demonstrates the ability to make significant improvements in function in a reasonable and predictable amount of time.     Precautions / Restrictions Precautions Precautions: Fall Restrictions Weight Bearing Restrictions: No      Mobility  Bed Mobility Overal bed mobility: Modified Independent             General bed mobility comments: slighlty slow    Transfers Overall transfer level: Modified independent Equipment used: None               General transfer comment: labored, unsteady upon standing, uses bed rail for transfer and reaches for IV pole upon standing    Ambulation/Gait Ambulation/Gait assistance: Min guard Gait Distance (Feet): 200 Feet Assistive device: IV Pole Gait Pattern/deviations: Step-through pattern, Decreased stride length, Decreased stance time - right Gait velocity: decreased     General Gait Details: impaired RLE coordination and antalgic on R  Stairs            Wheelchair Mobility    Modified Rankin (Stroke Patients Only)       Balance Overall balance assessment: Needs assistance Sitting-balance support: No upper extremity supported, Feet supported Sitting balance-Leahy Scale: Good Sitting balance - Comments: seated EOB   Standing balance support: Single extremity supported, During functional activity Standing balance-Leahy Scale: Fair Standing balance comment: with IV pole  Pertinent Vitals/Pain Pain Assessment Pain Assessment: Faces Faces Pain Scale: Hurts a little bit Pain Location: bilateral shoulders Pain Descriptors / Indicators: Sore Pain  Intervention(s): Limited activity within patient's tolerance, Monitored during session, Repositioned    Home Living Family/patient expects to be discharged to:: Private residence Living Arrangements: Spouse/significant other   Type of Home: Mobile home Home Access: Stairs to enter Entrance Stairs-Rails: Right;Left;Can reach both Entrance Stairs-Number of Steps: 5   Home Layout: One level Home Equipment: Conservation officer, nature (2 wheels);Cane - single point      Prior Function Prior Level of Function : Independent/Modified Independent;Working/employed;Driving             Mobility Comments: states community ambulation without AD ADLs Comments: independent     Hand Dominance        Extremity/Trunk Assessment   Upper Extremity Assessment Upper Extremity Assessment: Defer to OT evaluation    Lower Extremity Assessment Lower Extremity Assessment: RLE deficits/detail RLE Deficits / Details: grossly 4/5 RLE Sensation: WNL RLE Coordination: decreased gross motor    Cervical / Trunk Assessment Cervical / Trunk Assessment: Normal  Communication   Communication: No difficulties  Cognition Arousal/Alertness: Awake/alert Behavior During Therapy: WFL for tasks assessed/performed Overall Cognitive Status: Within Functional Limits for tasks assessed                                          General Comments      Exercises     Assessment/Plan    PT Assessment Patient needs continued PT services  PT Problem List Decreased strength;Decreased mobility;Decreased coordination;Decreased activity tolerance;Decreased balance;Decreased knowledge of use of DME       PT Treatment Interventions DME instruction;Therapeutic exercise;Gait training;Balance training;Stair training;Neuromuscular re-education;Functional mobility training;Therapeutic activities;Patient/family education    PT Goals (Current goals can be found in the Care Plan section)  Acute Rehab PT  Goals Patient Stated Goal: Return home PT Goal Formulation: With patient Time For Goal Achievement: 08/30/21 Potential to Achieve Goals: Good    Frequency Min 3X/week     Co-evaluation               AM-PAC PT "6 Clicks" Mobility  Outcome Measure Help needed turning from your back to your side while in a flat bed without using bedrails?: None Help needed moving from lying on your back to sitting on the side of a flat bed without using bedrails?: None Help needed moving to and from a bed to a chair (including a wheelchair)?: A Little Help needed standing up from a chair using your arms (e.g., wheelchair or bedside chair)?: A Little Help needed to walk in hospital room?: A Little Help needed climbing 3-5 steps with a railing? : A Little 6 Click Score: 20    End of Session   Activity Tolerance: Patient tolerated treatment well Patient left: in bed;with call bell/phone within reach Nurse Communication: Mobility status PT Visit Diagnosis: Unsteadiness on feet (R26.81);Other abnormalities of gait and mobility (R26.89);Muscle weakness (generalized) (M62.81);Other symptoms and signs involving the nervous system (R29.898)    Time: 7353-2992 PT Time Calculation (min) (ACUTE ONLY): 12 min   Charges:   PT Evaluation $PT Eval Low Complexity: 1 Low PT Treatments $Therapeutic Activity: 8-22 mins        8:40 AM, 08/23/21 Mearl Latin PT, DPT Physical Therapist at [facility]

## 2021-08-23 NOTE — Discharge Instructions (Signed)
1)Avoid ibuprofen/Advil/Aleve/Motrin/Goody Powders/Naproxen/BC powders/Meloxicam/Diclofenac/Indomethacin and other Nonsteroidal anti-inflammatory medications as these will make you more likely to bleed and can cause stomach ulcers, can also cause Kidney problems.   2)Please take Aspirin 81 mg daily along with Plavix 75 mg daily for 21 days then after that STOP the Plavix  and continue ONLY Aspirin 81 mg daily indefinitely--for secondary stroke Prevention   3) please cut back on your alcoholic intake  4)Please follow-up with Neurologist Dr. Phillips Odor-- Phone: [phone], Address: [address], [place], [place] [address] in 4 to 6 weeks for recheck and reevaluation.  Please call to make appointment with him

## 2021-08-23 NOTE — Discharge Summary (Signed)
Bruce Mora, is a 51 y.o. male  DOB [birth year]  MRN [id number].  Admission date:  08/22/2021  Admitting Physician  Bethena Roys, MD  Discharge Date:  08/23/2021   Primary MD  [facility]  Recommendations for primary care physician for things to follow:   1)Avoid ibuprofen/Advil/Aleve/Motrin/Goody Powders/Naproxen/BC powders/Meloxicam/Diclofenac/Indomethacin and other Nonsteroidal anti-inflammatory medications as these will make you more likely to bleed and can cause stomach ulcers, can also cause Kidney problems.   2)Please take Aspirin 81 mg daily along with Plavix 75 mg daily for 21 days then after that STOP the Plavix  and continue ONLY Aspirin 81 mg daily indefinitely--for secondary stroke Prevention   3) please cut back on your alcoholic intake  4)Please follow-up with Neurologist Dr. Phillips Odor-- Phone: [phone], Address: [address], [place], [place] [address] in 4 to 6 weeks for recheck and reevaluation.  Please call to make appointment with him  Admission Diagnosis  Essential hypertension, benign [I10] Ataxia [R27.0] Facial asymmetry [Q67.0] Weakness of right upper extremity [R29.898] Stroke-like symptoms [R29.90] Weakness of right lower extremity [R29.898] Type 2 diabetes mellitus with diabetic neuropathy, without long-term current use of insulin (HCC) [E11.40] Hyperlipidemia, unspecified hyperlipidemia type [E78.5]   Discharge Diagnosis  Essential hypertension, benign [I10] Ataxia [R27.0] Facial asymmetry [Q67.0] Weakness of right upper extremity [R29.898] Stroke-like symptoms [R29.90] Weakness of right lower extremity [R29.898] Type 2 diabetes mellitus with diabetic neuropathy, without long-term current use of insulin (HCC) [E11.40] Hyperlipidemia, unspecified hyperlipidemia type [E78.5]    Principal Problem:   Right sided  weakness Active Problems:   Type 2 diabetes mellitus with diabetic neuropathy ([place])   Essential hypertension, benign      Past Medical History:  Diagnosis Date   Atelectasis    chorine gas exposure, hospitalization   Diabetes mellitus    Diabetic neuropathy ([place])    Diabetic retinopathy    laser surgery - 2010   Dyslipidemia    ED (erectile dysfunction)    History of hepatitis B vaccination    Hypertension    Microalbuminuria    Refused pneumococcal vaccine 03/2012    Past Surgical History:  Procedure Laterality Date   CHOLECYSTECTOMY     COLONOSCOPY WITH PROPOFOL N/A 08/27/2020   Procedure: COLONOSCOPY WITH PROPOFOL;  Surgeon: Annamaria Helling, DO;  Location: [facility] ENDOSCOPY;  Service: Endoscopy;  Laterality: N/A;   ESOPHAGOGASTRODUODENOSCOPY (EGD) WITH PROPOFOL N/A 08/27/2020   Procedure: ESOPHAGOGASTRODUODENOSCOPY (EGD) WITH PROPOFOL;  Surgeon: Annamaria Helling, DO;  Location: [facility];  Service: Endoscopy;  Laterality: N/A;   HERNIA REPAIR     umbilical x 2   WISDOM TOOTH EXTRACTION         HPI  from the history and physical done on the day of admission:     Chief Complaint: Right sided weakness   HPI: ERIC NEES is a 52 y.o. male with medical history significant for diabetes mellitus, hypertension. Patient presented to the ED with complaints of weakness, facial droop and  limping while walking with pain between his shoulder blades tested yesterday morning at about 530. Family noticed that the right side of his face appeared to be droopy.  Patient denies any change in vision, no slowed or abnormal speech noted.  No prior strokes.  He does not take aspirin, reports allergy-of bleeding, but then he was also on a lot of Goody powders.   ED Course: Stable vitals.  Blood pressure 1 69-6 45 systolic.  EKG shows sinus tachycardia rate 104.  CTA head and neck without acute intracranial process, limited study, but no large vessel occlusion or hemodynamically  significant stenosis in the head or neck. Due to reports of between his shoulder blades, CTA chest abdomen and pelvis- Negative for aortic dissection or other intra-abdominal pathology. Teleneurology consulted- stoke workup, admit here.   Review of Systems: As per HPI all other systems reviewed and negative.    Hospital Course:    Assessment and Plan: 1) acute stroke /Acute infarct in the left pons with right sided weakness -Imaging studies with evidence of acute infarct of the left pons as well as remote left corona radiata, right basal ganglia, pontine and cerebellar lacunar infarcts  -No LVO 1)Please take Aspirin 81 mg daily along with Plavix 75 mg daily for 21 days then after that STOP the Plavix  and continue ONLY Aspirin 81 mg daily indefinitely--for secondary stroke Prevention (Per The multicenter SAMMPRIS trial) -History of intolerance to statins with arthralgias and myalgias, strongly recommend low intensity statin hopefully can tolerate this --LDL is 84 HDL is 63 cholesterol is 157 and triglycerides 67 Even if his lipid panel is within desired limits, patient should still take Lipitor/Statin for it's Pleiotropic effects (beyond cholesterol lowering benefits) given imaging evidence of multiple prior strokes and current new acute stroke -Outpatient follow-up with neurologist Dr. Merlene Laughter strongly advised -Echo with EF of 50% no regional wall motion abnormalities patient does have grade 1 diastolic dysfunction..  No aortic stenosis and no mitral stenosis  2)Essential hypertension, benign Systolic ranging from 295-284.  Reports she was previously on antihypertensives, at this was discontinued about a year ago.  3)Type 2 diabetes mellitus with diabetic neuropathy (HCC) -A1c 6.1 reflecting excellent diabetic control PTA Continue metformin and Ozempic   4) EtOH abuse--- moderation of alcohol use advised   Discharge Condition: Stable  Follow UP   Follow-up Information      [facility] Follow up.   Specialty: Rehabilitation Why: Both PT and OT have been ordered. They will reach out to you to set up appointment times. Contact information: [address] [id number] [place] [phone]                 Diet and Activity recommendation:  As advised  Discharge Instructions    Discharge Instructions     Ambulatory referral to Occupational Therapy   Complete by: As directed    Ambulatory referral to Physical Therapy   Complete by: As directed    Call MD for:  difficulty breathing, headache or visual disturbances   Complete by: As directed    Call MD for:  persistant dizziness or light-headedness   Complete by: As directed    Call MD for:  persistant nausea and vomiting   Complete by: As directed    Call MD for:  temperature >100.4   Complete by: As directed    Diet - low sodium heart healthy   Complete by: As directed  Diet Carb Modified   Complete by: As directed    Discharge instructions   Complete by: As directed    1)Avoid ibuprofen/Advil/Aleve/Motrin/Goody Powders/Naproxen/BC powders/Meloxicam/Diclofenac/Indomethacin and other Nonsteroidal anti-inflammatory medications as these will make you more likely to bleed and can cause stomach ulcers, can also cause Kidney problems.   2)Please take Aspirin 81 mg daily along with Plavix 75 mg daily for 21 days then after that STOP the Plavix  and continue ONLY Aspirin 81 mg daily indefinitely--for secondary stroke Prevention   3) please cut back on your alcoholic intake  4)Please follow-up with Neurologist Dr. Phillips Odor-- Phone: [phone], Address: [address], [place], [place] [address] in 4 to 6 weeks for recheck and reevaluation.  Please call to make appointment with him   Increase activity slowly   Complete by: As directed        Discharge Medications     Allergies as of 08/23/2021       Reactions    Asa Buff (mag [buffered Aspirin] Other (See Comments)   GI upset----was also taking BC Powders    Glucophage [metformin Hydrochloride] Rash   Rash?   Ibuprofen Other (See Comments)   Upset stomach   Lipitor [atorvastatin Calcium] Other (See Comments)   Myalgias and arthralgias   Prilosec [omeprazole Magnesium] Other (See Comments)   GI upset        Medication List     TAKE these medications    aspirin EC 81 MG tablet Take 1 tablet (81 mg total) by mouth daily with breakfast. Please take Aspirin 81 mg daily along with Plavix 75 mg daily for 21 days then after that STOP the Plavix  and continue ONLY Aspirin 81 mg daily indefinitely--for stroke Prevention   atorvastatin 20 MG tablet Commonly known as: Lipitor Take 1 tablet (20 mg total) by mouth daily. For stroke prevention   clopidogrel 75 MG tablet Commonly known as: PLAVIX Take 1 tablet (75 mg total) by mouth daily. Please take Aspirin 81 mg daily along with Plavix 75 mg daily for 21 days then after that STOP the Plavix  and continue ONLY Aspirin 81 mg daily indefinitely--for stroke Prevention Start taking on: August 24, 2021   metFORMIN 500 MG 24 hr tablet Commonly known as: GLUCOPHAGE-XR Take 500 mg by mouth 2 (two) times daily with a meal.   Ozempic (1 MG/DOSE) 4 MG/3ML Sopn Generic drug: Semaglutide (1 MG/DOSE) Inject 1 mg into the skin once a week.   pantoprazole 40 MG tablet Commonly known as: Protonix Take 1 tablet (40 mg total) by mouth daily.   sildenafil 50 MG tablet Commonly known as: VIAGRA Take 50 mg by mouth daily as needed for erectile dysfunction.   tamsulosin 0.4 MG Caps capsule Commonly known as: FLOMAX Take 0.4 mg by mouth daily.        Major procedures and Radiology Reports - PLEASE review detailed and final reports for all details, in brief -   ECHOCARDIOGRAM COMPLETE  Result Date: 08/23/2021    ECHOCARDIOGRAM REPORT   Patient Name:   LARONE KLIETHERMES Andreasen Date of Exam: 08/23/2021 Medical  Rec #:  [id number]        Height:       70.0 in Accession #:    [id number]       Weight:       165.0 lb Date of Birth:  1970-12-16       BSA:          1.923 m Patient Age:  50 years         BP:           149/90 mmHg Patient Gender: M                HR:           75 bpm. Exam Location:  [facility] Procedure: 2D Echo, Cardiac Doppler and Color Doppler Indications:    Stroke  History:        Patient has no prior history of Echocardiogram examinations.                 Stroke; Risk Factors:Hypertension, Diabetes and Dyslipidemia.  Sonographer:    Wenda Low Referring Phys: [place]  1. Left ventricular ejection fraction, by estimation, is 50%. The left ventricle has low normal function. The left ventricle has no regional wall motion abnormalities. Left ventricular diastolic parameters are consistent with Grade I diastolic dysfunction (impaired relaxation).  2. Right ventricular systolic function is normal. The right ventricular size is normal.  3. Left atrial size was mild to moderately dilated.  4. The mitral valve is normal in structure. No evidence of mitral valve regurgitation. No evidence of mitral stenosis.  5. The aortic valve is tricuspid. Aortic valve regurgitation is not visualized. No aortic stenosis is present.  6. The inferior vena cava is normal in size with greater than 50% respiratory variability, suggesting right atrial pressure of 3 mmHg. FINDINGS  Left Ventricle: Left ventricular ejection fraction, by estimation, is 50%. The left ventricle has low normal function. The left ventricle has no regional wall motion abnormalities. The left ventricular internal cavity size was normal in size. There is no left ventricular hypertrophy. Left ventricular diastolic parameters are consistent with Grade I diastolic dysfunction (impaired relaxation). Normal left ventricular filling pressure. Right Ventricle: The right ventricular size is normal. Right vetricular wall thickness was  not well visualized. Right ventricular systolic function is normal. Left Atrium: Left atrial size was mild to moderately dilated. Right Atrium: Right atrial size was normal in size. Pericardium: There is no evidence of pericardial effusion. Mitral Valve: The mitral valve is normal in structure. No evidence of mitral valve regurgitation. No evidence of mitral valve stenosis. MV peak gradient, 5.4 mmHg. The mean mitral valve gradient is 2.0 mmHg. Tricuspid Valve: The tricuspid valve is normal in structure. Tricuspid valve regurgitation is not demonstrated. No evidence of tricuspid stenosis. Aortic Valve: The aortic valve is tricuspid. Aortic valve regurgitation is not visualized. No aortic stenosis is present. Aortic valve mean gradient measures 3.0 mmHg. Aortic valve peak gradient measures 5.6 mmHg. Aortic valve area, by VTI measures 2.71 cm. Pulmonic Valve: The pulmonic valve was normal in structure. Pulmonic valve regurgitation is mild. No evidence of pulmonic stenosis. Aorta: The aortic root is normal in size and structure. Venous: The inferior vena cava is normal in size with greater than 50% respiratory variability, suggesting right atrial pressure of 3 mmHg. IAS/Shunts: No atrial level shunt detected by color flow Doppler.  LEFT VENTRICLE PLAX 2D LVIDd:         5.00 cm      Diastology LVIDs:         3.90 cm      LV e' medial:    5.00 cm/s LV PW:         1.00 cm      LV E/e' medial:  17.3 LV IVS:        1.00 cm  LV e' lateral:   8.59 cm/s LVOT diam:     2.10 cm      LV E/e' lateral: 10.1 LV SV:         77 LV SV Index:   40 LVOT Area:     3.46 cm  LV Volumes (MOD) LV vol d, MOD A2C: 110.0 ml LV vol d, MOD A4C: 98.0 ml LV vol s, MOD A2C: 52.5 ml LV vol s, MOD A4C: 52.4 ml LV SV MOD A2C:     57.5 ml LV SV MOD A4C:     98.0 ml LV SV MOD BP:      54.8 ml RIGHT VENTRICLE RV Basal diam:  3.20 cm RV Mid diam:    3.30 cm RV S prime:     10.10 cm/s TAPSE (M-mode): 2.2 cm LEFT ATRIUM             Index        RIGHT  ATRIUM           Index LA diam:        4.30 cm 2.24 cm/m   RA Area:     14.80 cm LA Vol (A2C):   76.8 ml 39.93 ml/m  RA Volume:   34.80 ml  18.09 ml/m LA Vol (A4C):   75.0 ml 38.99 ml/m LA Biplane Vol: 77.8 ml 40.45 ml/m  AORTIC VALVE                    PULMONIC VALVE AV Area (Vmax):    2.96 cm     PV Vmax:       0.76 m/s AV Area (Vmean):   2.69 cm     PV Peak grad:  2.3 mmHg AV Area (VTI):     2.71 cm AV Vmax:           118.00 cm/s AV Vmean:          80.900 cm/s AV VTI:            0.285 m AV Peak Grad:      5.6 mmHg AV Mean Grad:      3.0 mmHg LVOT Vmax:         101.00 cm/s LVOT Vmean:        62.900 cm/s LVOT VTI:          0.223 m LVOT/AV VTI ratio: 0.78  AORTA Ao Root diam: 3.30 cm MITRAL VALVE MV Area (PHT): 3.72 cm     SHUNTS MV Area VTI:   2.57 cm     Systemic VTI:  0.22 m MV Peak grad:  5.4 mmHg     Systemic Diam: 2.10 cm MV Mean grad:  2.0 mmHg MV Vmax:       1.16 m/s MV Vmean:      70.5 cm/s MV Decel Time: 204 msec MV E velocity: 86.50 cm/s MV A velocity: 108.00 cm/s MV E/A ratio:  0.80 Carlyle Dolly MD Electronically signed by Carlyle Dolly MD Signature Date/Time: 08/23/2021/1:15:07 PM    Final    MR BRAIN WO CONTRAST  Result Date: 08/23/2021 CLINICAL DATA:  Right-sided upper and lower extremity weakness, mild right facial droop, difficulty walking. EXAM: MRI HEAD WITHOUT CONTRAST MRA HEAD WITHOUT CONTRAST TECHNIQUE: Multiplanar, multi-echo pulse sequences of the brain and surrounding structures were acquired without intravenous contrast. Angiographic images of the Circle of Willis were acquired using MRA technique without intravenous contrast. COMPARISON:  CTA head/neck 08/22/2021. FINDINGS: MRI HEAD FINDINGS Brain: Acute infarct in the  left pons. Mild associated without mass effect. Age-advanced T2/FLAIR hyperintensities within the white matter. Remote left corona radiata, right basal ganglia, pontine and cerebellar lacunar infarcts. No evidence of acute hemorrhage, mass lesion, midline  shift or hydrocephalus. Vascular: See below. Skull and upper cervical spine: Normal marrow signal. Sinuses/Orbits: No acute or significant finding. Other: No mastoid effusions. MRA HEAD FINDINGS Anterior circulation: Bilateral intracranial ICAs, MCAs and ACAs are patent without proximal hemodynamically significant stenosis. Posterior circulation: Bilateral intradural vertebral arteries, basilar artery and bilateral posterior arteries are patent without proximal hemodynamically significant stenosis. Left posterior communicating arteries noted. IMPRESSION: 1. Acute infarct in the left pons. Mild associated without mass effect. 2. Age-advanced T2/FLAIR hyperintensities within the white matter, which probably represent the sequela of accelerated chronic microvascular ischemic disease given the patient's risk factors. 3. Remote left corona radiata, right basal ganglia, pontine and cerebellar lacunar infarcts. 4. No emergent large vessel occlusion or proximal hemodynamically significant stenosis. Electronically Signed   By: Margaretha Sheffield M.D.   On: 08/23/2021 09:08   MR ANGIO HEAD WO CONTRAST  Result Date: 08/23/2021 CLINICAL DATA:  Right-sided upper and lower extremity weakness, mild right facial droop, difficulty walking. EXAM: MRI HEAD WITHOUT CONTRAST MRA HEAD WITHOUT CONTRAST TECHNIQUE: Multiplanar, multi-echo pulse sequences of the brain and surrounding structures were acquired without intravenous contrast. Angiographic images of the Circle of Willis were acquired using MRA technique without intravenous contrast. COMPARISON:  CTA head/neck 08/22/2021. FINDINGS: MRI HEAD FINDINGS Brain: Acute infarct in the left pons. Mild associated without mass effect. Age-advanced T2/FLAIR hyperintensities within the white matter. Remote left corona radiata, right basal ganglia, pontine and cerebellar lacunar infarcts. No evidence of acute hemorrhage, mass lesion, midline shift or hydrocephalus. Vascular: See below. Skull  and upper cervical spine: Normal marrow signal. Sinuses/Orbits: No acute or significant finding. Other: No mastoid effusions. MRA HEAD FINDINGS Anterior circulation: Bilateral intracranial ICAs, MCAs and ACAs are patent without proximal hemodynamically significant stenosis. Posterior circulation: Bilateral intradural vertebral arteries, basilar artery and bilateral posterior arteries are patent without proximal hemodynamically significant stenosis. Left posterior communicating arteries noted. IMPRESSION: 1. Acute infarct in the left pons. Mild associated without mass effect. 2. Age-advanced T2/FLAIR hyperintensities within the white matter, which probably represent the sequela of accelerated chronic microvascular ischemic disease given the patient's risk factors. 3. Remote left corona radiata, right basal ganglia, pontine and cerebellar lacunar infarcts. 4. No emergent large vessel occlusion or proximal hemodynamically significant stenosis. Electronically Signed   By: Margaretha Sheffield M.D.   On: 08/23/2021 09:08   CT Angio Chest/Abd/Pel for Dissection W and/or W/WO  Result Date: 08/22/2021 CLINICAL DATA:  Back pain, weakness, facial droop EXAM: CT ANGIOGRAPHY CHEST, ABDOMEN AND PELVIS TECHNIQUE: Non-contrast CT of the chest was initially obtained. Multidetector CT imaging through the chest, abdomen and pelvis was performed using the standard protocol during bolus administration of intravenous contrast. Multiplanar reconstructed images and MIPs were obtained and reviewed to evaluate the vascular anatomy. RADIATION DOSE REDUCTION: This exam was performed according to the departmental dose-optimization program which includes automated exposure control, adjustment of the mA and/or kV according to patient size and/or use of iterative reconstruction technique. CONTRAST:  190m OMNIPAQUE IOHEXOL 350 MG/ML SOLN COMPARISON:  None Available. FINDINGS: CTA CHEST FINDINGS Cardiovascular: There is no demonstrable mural  hematoma in the thoracic aorta in the noncontrast images. Scattered coronary artery calcifications are seen. There is homogeneous enhancement in thoracic aorta. There is no demonstrable intimal flap. Contrast density in pulmonary artery branches is less than optimal.  As far as seen, there is no evidence of any large central pulmonary embolism in the main pulmonary arteries. Mediastinum/Nodes: No significant lymphadenopathy is seen. Lungs/Pleura: There is no focal pulmonary consolidation. There is no pleural effusion or pneumothorax. Musculoskeletal: No acute findings are seen in bony structures in thorax. Review of the MIP images confirms the above findings. CTA ABDOMEN AND PELVIS FINDINGS VASCULAR Aorta: Normal. There is no evidence of dissection or focal aneurysmal dilation. Celiac: Normal SMA: Normal Renals: Normal IMA: Proximal course of IMA is patent. Inflow: There are atherosclerotic plaques and calcifications in iliac arteries without dissection or stenosis or aneurysmal dilation. Veins: Unremarkable. Review of the MIP images confirms the above findings. NON-VASCULAR Hepatobiliary: There is fatty infiltration liver. Surgical clips are seen in gallbladder fossa. There is no significant dilation of bile ducts. Pancreas: No focal abnormalities are seen. Spleen: Unremarkable. Adrenals/Urinary Tract: Adrenals are not enlarged. There is no hydronephrosis. There are no renal or ureteral stones. Urinary bladder is unremarkable. Stomach/Bowel: Stomach is distended, possibly due to recent meal. Small bowel loops are not dilated. Appendix is not dilated. There is no pericecal inflammation. There is no significant focal wall thickening in colon. There is mild diffuse wall thickening in transverse colon. There is no pericolic stranding. Lymphatic: Unremarkable. Reproductive: There are small calcifications in the prostate. Other: There is no ascites or pneumoperitoneum. Musculoskeletal: No acute findings are seen. Review  of the MIP images confirms the above findings. IMPRESSION: There is no evidence of dissection in the thoracic and abdominal aorta. Major branches of thoracic and abdominal aorta are patent. There is no evidence of central pulmonary artery embolism. Scattered coronary artery calcifications are seen. There is no focal pulmonary consolidation. There is no evidence of intestinal obstruction or pneumoperitoneum. There is no hydronephrosis. Appendix is not dilated. There is mild diffuse wall thickening and transverse colon which may be an artifact due to incomplete distention all suggest chronic nonspecific colitis. There is no pericolic stranding or fluid collection. Electronically Signed   By: Elmer Picker M.D.   On: 08/22/2021 18:10   CT ANGIO HEAD NECK W WO CM  Result Date: 08/22/2021 CLINICAL DATA:  Weakness, facial droop, limp EXAM: CT ANGIOGRAPHY HEAD AND NECK TECHNIQUE: Multidetector CT imaging of the head and neck was performed using the standard protocol during bolus administration of intravenous contrast. Multiplanar CT image reconstructions and MIPs were obtained to evaluate the vascular anatomy. Carotid stenosis measurements (when applicable) are obtained utilizing NASCET criteria, using the distal internal carotid diameter as the denominator. RADIATION DOSE REDUCTION: This exam was performed according to the departmental dose-optimization program which includes automated exposure control, adjustment of the mA and/or kV according to patient size and/or use of iterative reconstruction technique. CONTRAST:  142m OMNIPAQUE IOHEXOL 350 MG/ML SOLN COMPARISON:  None Available. FINDINGS: CT HEAD FINDINGS Brain: No evidence of acute infarct, hemorrhage, mass, mass effect, or midline shift. No hydrocephalus or extra-axial fluid collection. Vascular: No hyperdense vessel. Skull: Normal. Negative for fracture or focal lesion. Sinuses/Orbits: No acute finding. Other: The mastoid air cells are well aerated.  CTA NECK FINDINGS Evaluation is somewhat limited by bolus timing. Aortic arch: Standard branching. Imaged portion shows no evidence of aneurysm or dissection. No significant stenosis of the major arch vessel origins. Right carotid system: No evidence of dissection, occlusion, or hemodynamically significant stenosis (greater than 50%). Atherosclerotic disease at the bifurcation and in the proximal ICA is not hemodynamically significant. Left carotid system: No evidence of dissection, occlusion, or hemodynamically significant stenosis (  greater than 50%). Vertebral arteries: No evidence of dissection, occlusion, or hemodynamically significant stenosis (greater than 50%). Skeleton: No acute osseous abnormality. Mild degenerative changes in the cervical spine. Other neck: Subcentimeter hypoattenuating lesions in the thyroid, for which no follow-up is indicated. (Reference: J Am Coll Radiol. 2015 Feb;12(2): 143-50) Upper chest: Negative. Review of the MIP images confirms the above findings CTA HEAD FINDINGS Evaluation is somewhat limited by bolus timing and venous contamination. Anterior circulation: Both internal carotid arteries are patent to the termini, with moderate stenosis in the bilateral supraclinoid ICA. A1 segments patent.  Proximal A2 and A3 segments are patent. No M1 stenosis or occlusion.  M2 and proximal M3 segments. Posterior circulation: Vertebral arteries patent to the vertebrobasilar junction without stenosis. Posterior inferior cerebellar arteries patent proximally. Basilar patent to its distal aspect. Right SCA is patent proximally. Poor visualization of the left SCA. Patent P1 segments. P2 segments are perfused. Poor visualization of the P3 segments. The bilateral posterior communicating arteries are not visualized. Venous sinuses: As permitted by contrast timing, patent. Anatomic variants: None significant. Review of the MIP images confirms the above findings IMPRESSION: 1.  No acute intracranial  process. 2. Evaluation of the vasculature is limited by bolus timing. Within this limitation, no large vessel occlusion or hemodynamically significant stenosis in the head or neck. Electronically Signed   By: Merilyn Baba M.D.   On: 08/22/2021 17:59    Micro Results  Recent Results (from the past 240 hour(s))  Resp Panel by RT-PCR (Flu A&B, Covid) Anterior Nasal Swab     Status: None   Collection Time: 08/22/21  4:48 PM   Specimen: Anterior Nasal Swab  Result Value Ref Range Status   SARS Coronavirus 2 by RT PCR NEGATIVE NEGATIVE Final    Comment: (NOTE) SARS-CoV-2 target nucleic acids are NOT DETECTED.  The SARS-CoV-2 RNA is generally detectable in upper respiratory specimens during the acute phase of infection. The lowest concentration of SARS-CoV-2 viral copies this assay can detect is 138 copies/mL. A negative result does not preclude SARS-Cov-2 infection and should not be used as the sole basis for treatment or other patient management decisions. A negative result may occur with  improper specimen collection/handling, submission of specimen other than nasopharyngeal swab, presence of viral mutation(s) within the areas targeted by this assay, and inadequate number of viral copies(<138 copies/mL). A negative result must be combined with clinical observations, patient history, and epidemiological information. The expected result is Negative.  Fact Sheet for Patients:  EntrepreneurPulse.com.au  Fact Sheet for Healthcare Providers:  IncredibleEmployment.be  This test is no t yet approved or cleared by the [place] FDA and  has been authorized for detection and/or diagnosis of SARS-CoV-2 by FDA under an Emergency Use Authorization (EUA). This EUA will remain  in effect (meaning this test can be used) for the duration of the COVID-19 declaration under Section 564(b)(1) of the Act, 21 U.S.C.section 360bbb-3(b)(1), unless the authorization is  terminated  or revoked sooner.       Influenza A by PCR NEGATIVE NEGATIVE Final   Influenza B by PCR NEGATIVE NEGATIVE Final    Comment: (NOTE) The Xpert Xpress SARS-CoV-2/FLU/RSV plus assay is intended as an aid in the diagnosis of influenza from Nasopharyngeal swab specimens and should not be used as a sole basis for treatment. Nasal washings and aspirates are unacceptable for Xpert Xpress SARS-CoV-2/FLU/RSV testing.  Fact Sheet for Patients: EntrepreneurPulse.com.au  Fact Sheet for Healthcare Providers: IncredibleEmployment.be  This test is not yet approved  or cleared by the [place] and has been authorized for detection and/or diagnosis of SARS-CoV-2 by FDA under an Emergency Use Authorization (EUA). This EUA will remain in effect (meaning this test can be used) for the duration of the COVID-19 declaration under Section 564(b)(1) of the Act, 21 U.S.C. section 360bbb-3(b)(1), unless the authorization is terminated or revoked.  Performed at [facility], [address] [place], [place] [address]     Today   Subjective    Loran Fleet today has no new complaints     No fever  Or chills   No Nausea, Vomiting or Diarrhea      Patient has been seen and examined prior to discharge   Objective   Blood pressure 127/77, pulse 90, temperature 97.7 F (36.5 C), temperature source Oral, resp. rate 18, height '5\' 10"'$  (1.778 m), weight 74.8 kg, SpO2 100 %.   Intake/Output Summary (Last 24 hours) at 08/23/2021 1621 Last data filed at 08/23/2021 1300 Gross per 24 hour  Intake 480 ml  Output 500 ml  Net -20 ml    Exam Gen:- Awake Alert, no acute distress  HEENT:- [place].AT, No sclera icterus Neck-Supple Neck,No JVD,.  Lungs-  CTAB , good air movement bilaterally CV- S1, S2 normal, regular Abd-  +ve B.Sounds, Abd Soft, No tenderness,    Extremity/Skin:- No  edema,   good pulses Psych-affect is appropriate, oriented  x3 Neuro-right-sided facial droop is resolved, right-sided weakness appears to be resolving nicely ,no additional new focal deficits, no tremors    Data Review   CBC w Diff:  Lab Results  Component Value Date   WBC 3.9 (L) 08/22/2021   HGB 12.5 (L) 08/22/2021   HCT 37.9 (L) 08/22/2021   PLT 218 08/22/2021   LYMPHOPCT 37 04/01/2013   MONOPCT 8 04/01/2013   EOSPCT 1 04/01/2013   BASOPCT 0 04/01/2013    CMP:  Lab Results  Component Value Date   NA 136 08/22/2021   K 4.3 08/22/2021   CL 102 08/22/2021   CO2 27 08/22/2021   BUN 11 08/22/2021   CREATININE 0.84 08/22/2021   CREATININE 0.91 04/06/2012   PROT 6.2 (L) 08/22/2021   ALBUMIN 3.4 (L) 08/22/2021   BILITOT 0.6 08/22/2021   ALKPHOS 44 08/22/2021   AST 17 08/22/2021   ALT 18 08/22/2021  .  Total Discharge time is about 33 minutes  Roxan Hockey M.D on 08/23/2021 at 4:21 PM  Go to [email or web address] -  for contact info  [facility] - Office  [phone]

## 2021-08-29 ENCOUNTER — Ambulatory Visit (HOSPITAL_COMMUNITY): Payer: BC Managed Care – PPO | Attending: Family Medicine | Admitting: Occupational Therapy

## 2021-08-29 ENCOUNTER — Encounter (HOSPITAL_COMMUNITY): Payer: Self-pay | Admitting: Occupational Therapy

## 2021-08-29 DIAGNOSIS — R278 Other lack of coordination: Secondary | ICD-10-CM | POA: Diagnosis present

## 2021-08-29 DIAGNOSIS — R29818 Other symptoms and signs involving the nervous system: Secondary | ICD-10-CM | POA: Insufficient documentation

## 2021-08-29 NOTE — Therapy (Signed)
OUTPATIENT OCCUPATIONAL THERAPY NEURO EVALUATION  Patient Name: BIJAN RIDGLEY MRN: [id number] DOB:1970/07/30, 51 y.o., male Today's Date: 08/29/2021  PCP: [facility] REFERRING PROVIDER: Roxan Hockey, MD   OT End of Session - 08/29/21 1418     Visit Number 1    Number of Visits 1    Date for OT Re-Evaluation 08/30/21    Authorization Type BCBS, $20 copay    Authorization Time Period 60 visit limit, 0 used on eval    Authorization - Visit Number 1    Authorization - Number of Visits 14    OT Start Time 1347    OT Stop Time 1412    OT Time Calculation (min) 25 min    Activity Tolerance Patient tolerated treatment well    Behavior During Therapy [facility] for tasks assessed/performed             Past Medical History:  Diagnosis Date   Atelectasis    chorine gas exposure, hospitalization   Diabetes mellitus    Diabetic neuropathy ([place])    Diabetic retinopathy    laser surgery - 2010   Dyslipidemia    ED (erectile dysfunction)    History of hepatitis B vaccination    Hypertension    Microalbuminuria    Refused pneumococcal vaccine 03/2012   Past Surgical History:  Procedure Laterality Date   CHOLECYSTECTOMY     COLONOSCOPY WITH PROPOFOL N/A 08/27/2020   Procedure: COLONOSCOPY WITH PROPOFOL;  Surgeon: Annamaria Helling, DO;  Location: [facility] ENDOSCOPY;  Service: Endoscopy;  Laterality: N/A;   ESOPHAGOGASTRODUODENOSCOPY (EGD) WITH PROPOFOL N/A 08/27/2020   Procedure: ESOPHAGOGASTRODUODENOSCOPY (EGD) WITH PROPOFOL;  Surgeon: Annamaria Helling, DO;  Location: [place];  Service: Endoscopy;  Laterality: N/A;   HERNIA REPAIR     umbilical x 2   WISDOM TOOTH EXTRACTION     Patient Active Problem List   Diagnosis Date Noted   Right sided weakness 08/22/2021   Decreased pulses in feet 04/20/2012   Dyspnea 04/20/2012   Diabetes type 2, uncontrolled 04/06/2012   Microalbuminuria 04/06/2012   Leg swelling 04/06/2012   Erectile dysfunction  04/06/2012   Type 2 diabetes mellitus with diabetic neuropathy ([place]) 02/25/2011   Essential hypertension, benign 02/25/2011   Hyperlipidemia 02/25/2011   Screening for prostate cancer 02/25/2011   Diabetic neuropathy ([place]) 02/25/2011   Tinea pedis 02/25/2011   Genital warts 02/25/2011   General medical examination 02/25/2011    ONSET DATE: 08/22/2021  REFERRING DIAG: s/p CVA  THERAPY DIAG:  Other symptoms and signs involving the nervous system  Other lack of coordination  Rationale for Evaluation and Treatment Rehabilitation  SUBJECTIVE:   SUBJECTIVE STATEMENT: S: Other than my handwriting I feel fine.  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 Reports he has been working on strengthening at home and now is feeling back to his baseline.   PRECAUTIONS: None  WEIGHT BEARING RESTRICTIONS No  PAIN:  Are you having pain? No  FALLS: Has patient fallen in last 6 months? No  PLOF: Independent  PATIENT GOALS To have better handwriting.   OBJECTIVE:   HAND DOMINANCE: Right  ADLs: Overall ADLs: pt reports independence with all ADLs, strength has improved. Has difficulty with handwriting. Pt is an Best boy, is Engineer, mining at [employer], nutritional. Handwriting assessed during evaluation, 75% legible both print and cursive, slightly increased time for some letter formation and fluidity.  UPPER EXTREMITY MMT:     MMT Right eval  Shoulder flexion 4+/5  Shoulder abduction 5/5  Shoulder internal rotation 5/5  Shoulder external rotation 5/5  Elbow flexion 5/5  Elbow extension 5/5  Wrist flexion 5/5  Wrist extension 5/5  Wrist ulnar deviation 5/5  Wrist radial deviation 5/5  Wrist pronation 5/5  Wrist supination 5/5  (Blank rows = not tested)  HAND FUNCTION: Grip strength: Right: 94 lbs; Left: 98 lbs, Lateral pinch: Right: 21 lbs, Left: 23 lbs, and  3 point pinch: Right: 15 lbs, Left: 17 lbs  COORDINATION: 9 Hole Peg test: Right: 35.23 sec; Left: 33.53 sec   COGNITION: Overall cognitive status: Within functional limits for tasks assessed  VISION: Subjective report: WFL Baseline vision:  supposed to wear glasses for reading and driving   PATIENT EDUCATION: Education details: provided pencil control packet, discussed Research scientist (life sciences) daily Person educated: Patient Education method: Explanation, Demonstration, and Handouts Education comprehension: verbalized understanding and returned demonstration   HOME EXERCISE PROGRAM: Eval: pencil control packet     ASSESSMENT:  CLINICAL IMPRESSION: Patient is a 51 y.o. 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 Pt reports strength has improved since hospital discharge. Pt demonstrating RUE ROM and strength WNL, coordination is intact. Pt with mild handwriting deficits, requiring increased time for control and fluidity. Provided HEP for handwriting practice and pencil control practice. No further OT services required at this time.    PERFORMANCE DEFICITS in functional skills including ADLs, IADLs, coordination, and dexterity  IMPAIRMENTS are limiting patient from ADLs and IADLs.   COMORBIDITIES has no other co-morbidities that affects occupational performance. Patient will benefit from skilled OT to address above impairments and improve overall function.  MODIFICATION OR ASSISTANCE TO COMPLETE EVALUATION: No modification of tasks or assist necessary to complete an evaluation.  OT OCCUPATIONAL PROFILE AND HISTORY: Problem focused assessment: Including review of records relating to presenting problem.  CLINICAL DECISION MAKING: LOW - limited treatment options, no task modification necessary  REHAB POTENTIAL: Good  EVALUATION COMPLEXITY: Low    PLAN: OT FREQUENCY: one time visit  OT DURATION: other: 1  session-eval only   PLANNED INTERVENTIONS: patient/family education   CONSULTED AND AGREED WITH PLAN OF CARE: Patient  PLAN FOR NEXT SESSION: N/A-evaluation only. Pt provided with HEP   Guadelupe Sabin, OTR/L  [phone] 08/29/2021, 2:19 PM

## 2021-08-29 NOTE — Therapy (Signed)
OUTPATIENT PHYSICAL THERAPY NEURO EVALUATION   Patient Name: Bruce Mora MRN: [id number] DOB:1970-06-02, 51 y.o., male Today's Date: 08/30/2021   PCP: [place] PROVIDER: Roxan Hockey, MD   PT End of Session - 08/30/21 0846     Visit Number 1    Number of Visits 1    Authorization Type BCBS    PT Start Time 0810    PT Stop Time 0840    PT Time Calculation (min) 30 min    Activity Tolerance Patient tolerated treatment well    Behavior During Therapy [facility] for tasks assessed/performed             Past Medical History:  Diagnosis Date   Atelectasis    chorine gas exposure, hospitalization   Diabetes mellitus    Diabetic neuropathy ([place])    Diabetic retinopathy    laser surgery - 2010   Dyslipidemia    ED (erectile dysfunction)    History of hepatitis B vaccination    Hypertension    Microalbuminuria    Refused pneumococcal vaccine 03/2012   Past Surgical History:  Procedure Laterality Date   CHOLECYSTECTOMY     COLONOSCOPY WITH PROPOFOL N/A 08/27/2020   Procedure: COLONOSCOPY WITH PROPOFOL;  Surgeon: Annamaria Helling, DO;  Location: [facility] ENDOSCOPY;  Service: Endoscopy;  Laterality: N/A;   ESOPHAGOGASTRODUODENOSCOPY (EGD) WITH PROPOFOL N/A 08/27/2020   Procedure: ESOPHAGOGASTRODUODENOSCOPY (EGD) WITH PROPOFOL;  Surgeon: Annamaria Helling, DO;  Location: [place];  Service: Endoscopy;  Laterality: N/A;   HERNIA REPAIR     umbilical x 2   WISDOM TOOTH EXTRACTION     Patient Active Problem List   Diagnosis Date Noted   Right sided weakness 08/22/2021   Decreased pulses in feet 04/20/2012   Dyspnea 04/20/2012   Diabetes type 2, uncontrolled 04/06/2012   Microalbuminuria 04/06/2012   Leg swelling 04/06/2012   Erectile dysfunction 04/06/2012   Type 2 diabetes mellitus with diabetic neuropathy ([place]) 02/25/2011   Essential hypertension, benign 02/25/2011   Hyperlipidemia 02/25/2011   Screening for prostate cancer 02/25/2011    Diabetic neuropathy ([place]) 02/25/2011   Tinea pedis 02/25/2011   Genital warts 02/25/2011   General medical examination 02/25/2011    ONSET DATE: 08/22/2021  REFERRING DIAG: CVA with weakness   THERAPY DIAG:  Other symptoms and signs involving the nervous system  Rationale for Evaluation and Treatment Rehabilitation  SUBJECTIVE:                                                                                                                                                                                              SUBJECTIVE  STATEMENT: Bruce Mora states that he feels like he is doing pretty decent.  His girlfriend feels like he is still stumbles but he can not tell.   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 Reports he has been working on strengthening at home and now is feeling back to his baseline. DM, neuropathy.  PAIN:  Are you having pain? No  PRECAUTIONS: None  WEIGHT BEARING RESTRICTIONS No  FALLS: Has patient fallen in last 6 months? No  LIVING ENVIRONMENT: Lives with: lives with their family Lives in: House/apartment Stairs: Yes: External: 6 steps; on right going up Has following equipment at home: None  PLOF: Independent , PT needs to go up and down ladders at work, and he is a Social research officer, government.   PATIENT GOALS Not sure   OBJECTIVE:   DIAGNOSTIC FINDINGS: IMPRESSION: 1. Acute infarct in the left pons. Mild associated without mass effect. 2. Age-advanced T2/FLAIR hyperintensities within the white matter, which probably represent the sequela of accelerated chronic microvascular ischemic disease given the patient's risk factors. 3. Remote left corona radiata, right basal ganglia, pontine and cerebellar lacunar infarcts. 4. No emergent large vessel occlusion or proximal  hemodynamically significant stenosis.     Electronically Signed   By: Margaretha Sheffield M.D.   On: 08/23/2021 09:08    COGNITION: Overall cognitive status: Within functional limits for tasks assessed   SENSATION: WFL  COORDINATION: WFL      LOWER EXTREMITY MMT:    MMT Right Eval Left Eval  Hip flexion 5/5 5/5  Hip extension 5/5 5/5  Hip abduction 5/5 5/5  Hip adduction    Hip internal rotation    Hip external rotation    Knee flexion 5/5 4+/5  Knee extension 4+/5 5/5  Ankle dorsiflexion 4+/5 5/5  Ankle plantarflexion    Ankle inversion    Ankle eversion    (Blank rows = not tested)  STAIRS:  Level of Assistance: Complete Independence  Stair Negotiation Technique: Alternating Pattern  with No Rails  Number of Stairs: 12   Height of Stairs: 7     FUNCTIONAL TESTs:  30 seconds chair stand test: 18 ( between below average and average for healthy males pt age) 2 minute walk test: 512 feet no deviation noted, no assisted device.  Single leg stance Rt: 33", LT 46"  TODAY'S TREATMENT:  Evaluation    PATIENT EDUCATION: Education details: Pt may benefit from working on balance at home but overall no skilled therapy is needed Person educated: Patient Education method: Explanation Education comprehension: verbalized understanding   HOME EXERCISE PROGRAM: Single leg stance    GOALS: Goals reviewed with patient? Yes  SHORT TERM GOALS: Target date:  08/30/2021  Pt to be working on balance daily at home  Baseline: Goal status: INITIAL   ASSESSMENT:  CLINICAL IMPRESSION: Patient is a 51 y.o. male who was seen today for physical therapy evaluation and treatment for s/p CVA.   PT evaluated with minimal deficits noted. PT has hx of neuropathy and feels that he is functioning close to his prior functional limits.  Evaluation demonstrates no need for skilled therapy at this time.   REHAB POTENTIAL: Excellent  CLINICAL DECISION MAKING:  Stable/uncomplicated  EVALUATION COMPLEXITY: Low  PLAN: PT FREQUENCY: 1x/week  PT DURATION: 1 week  PLANNED INTERVENTIONS: Therapeutic exercises,  Therapeutic activity, Neuromuscular re-education, Balance training, Patient/Family education, and Self Care  PLAN FOR NEXT SESSION: N/A one time visit    Rayetta Humphrey, [place] CLT [phone]  08/30/2021, 8:44

## 2021-08-30 ENCOUNTER — Ambulatory Visit (HOSPITAL_COMMUNITY): Payer: BC Managed Care – PPO | Admitting: Physical Therapy

## 2021-08-30 DIAGNOSIS — R29818 Other symptoms and signs involving the nervous system: Secondary | ICD-10-CM

## 2022-05-19 ENCOUNTER — Ambulatory Visit: Payer: BC Managed Care – PPO | Admitting: Orthopedic Surgery

## 2022-05-23 ENCOUNTER — Ambulatory Visit: Payer: BC Managed Care – PPO | Admitting: Orthopedic Surgery

## 2022-05-23 ENCOUNTER — Encounter: Payer: Self-pay | Admitting: Orthopedic Surgery

## 2022-05-23 VITALS — BP 152/86 | HR 90 | Ht 70.0 in | Wt 168.0 lb

## 2022-05-23 DIAGNOSIS — M5136 Other intervertebral disc degeneration, lumbar region: Secondary | ICD-10-CM | POA: Diagnosis not present

## 2022-05-23 DIAGNOSIS — M545 Low back pain, unspecified: Secondary | ICD-10-CM

## 2022-05-23 MED ORDER — GABAPENTIN 100 MG PO CAPS
100.0000 mg | ORAL_CAPSULE | Freq: Three times a day (TID) | ORAL | 2 refills | Status: AC
Start: 2022-05-23 — End: ?

## 2022-05-23 MED ORDER — TIZANIDINE HCL 4 MG PO TABS
4.0000 mg | ORAL_TABLET | Freq: Every day | ORAL | 1 refills | Status: AC
Start: 2022-05-23 — End: 2023-05-23

## 2022-05-23 NOTE — Progress Notes (Addendum)
Patient ID: Bruce Mora, male   DOB: 25-Mar-1970, 52 y.o.   MRN: [id number]  Office Visit Note   Patient: Bruce Mora           Date of Birth: May 31, 1970           MRN: [id number] Visit Date: 05/23/2022 Requested by: [facility] [address] [place],  [place] [address] PCP: [facility]  Assessment & Plan:  52 year old male degenerative disc disease right-sided back pain only radiates to the right thigh no prior therapy failed prednisone with elevation of glucose no red flags and no muscle weakness lower extremity sensory loss  Images personally read and my interpretation : Mild DJD mild coronal plane alignment slight abnormality (images are copied in the media section)  Visit Diagnoses:  1. DDD (degenerative disc disease), lumbar   2. Lumbar pain     Plan: Physical therapy, tizanidine, Tylenol Extra Strength, gabapentin  Follow-Up Instructions: Return if symptoms worsen or fail to improve.   Orders:  Meds ordered this encounter  Medications   tiZANidine (ZANAFLEX) 4 MG tablet    Sig: Take 1 tablet (4 mg total) by mouth daily.    Dispense:  30 tablet    Refill:  1   gabapentin (NEURONTIN) 100 MG capsule    Sig: Take 1 capsule (100 mg total) by mouth 3 (three) times daily.    Dispense:  90 capsule    Refill:  2     Chief Complaint  Patient presents with   Back Pain    HPI Bruce Mora is a 52 y.o. male.  Presents to [place] for evaluation of back pain for 2 months.  Seems to be located on the right side of the lower back area with no history of injury  No surgery.  He is on Plavix  He did try some prednisone and elevated his glucose did not provide relief right flank history none pain does not radiate to the foot pain radiates to the right thigh and below the knee  Allergies  Allergen Reactions   Asa Buff (Mag [Buffered Aspirin] Other (See Comments)    GI upset----was also taking BC Powders    Glucophage  [Metformin Hydrochloride] Rash    Rash?   Ibuprofen Other (See Comments)    Upset stomach   Lipitor [Atorvastatin Calcium] Other (See Comments)    Myalgias and arthralgias   Prilosec [Omeprazole Magnesium] Other (See Comments)    GI upset   Current Outpatient Medications  Medication Instructions   aspirin EC 81 mg, Oral, Daily with breakfast, Please take Aspirin 81 mg daily along with Plavix 75 mg daily for 21 days then after that STOP the Plavix  and continue ONLY Aspirin 81 mg daily indefinitely--for stroke Prevention   atorvastatin (LIPITOR) 20 mg, Oral, Daily, For stroke prevention   clopidogrel (PLAVIX) 75 mg, Oral, Daily, Please take Aspirin 81 mg daily along with Plavix 75 mg daily for 21 days then after that STOP the Plavix  and continue ONLY Aspirin 81 mg daily indefinitely--for stroke Prevention   Clopidogrel Bisulfate (PLAVIX PO) Plavix   Dulaglutide (TRULICITY) 1.5 MG/0.5ML SOPN INJECT 1.5 MG SUBCUTANEOUSLY ONCE A WEEK   gabapentin (NEURONTIN) 100 mg, Oral, 3 times daily   glipiZIDE (GLUCOTROL) 10 MG tablet Oral   hydrochlorothiazide (HYDRODIURIL) 25 MG tablet 1 tablet in the morning Orally Once a day   lisinopril (ZESTRIL) 20 MG tablet Oral   metFORMIN (GLUCOPHAGE-XR) 500 mg,  Oral, 2 times daily with meals   Ozempic (1 MG/DOSE) 1 mg, Subcutaneous, Weekly   pantoprazole (PROTONIX) 40 mg, Oral, Daily   sildenafil (VIAGRA) 50 mg, Oral, Daily PRN   tamsulosin (FLOMAX) 0.4 mg, Oral, Daily   tiZANidine (ZANAFLEX) 4 mg, Oral, Daily    Review of Systems Review of Systems  Musculoskeletal:  Positive for back pain.    Past Medical History:  Diagnosis Date   Atelectasis    chorine gas exposure, hospitalization   Diabetes mellitus    Diabetic neuropathy (HCC)    Diabetic retinopathy    laser surgery - 2010   Dyslipidemia    ED (erectile dysfunction)    History of hepatitis B vaccination    Hypertension    Microalbuminuria    Refused pneumococcal vaccine 03/2012     Past Surgical History:  Procedure Laterality Date   CHOLECYSTECTOMY     COLONOSCOPY WITH PROPOFOL N/A 08/27/2020   Procedure: COLONOSCOPY WITH PROPOFOL;  Surgeon: Jaynie Collins, DO;  Location: [facility] ENDOSCOPY;  Service: Endoscopy;  Laterality: N/A;   ESOPHAGOGASTRODUODENOSCOPY (EGD) WITH PROPOFOL N/A 08/27/2020   Procedure: ESOPHAGOGASTRODUODENOSCOPY (EGD) WITH PROPOFOL;  Surgeon: Jaynie Collins, DO;  Location: [facility] ENDOSCOPY;  Service: Endoscopy;  Laterality: N/A;   HERNIA REPAIR     umbilical x 2   WISDOM TOOTH EXTRACTION      Family History  Problem Relation Age of Onset   Diabetes Mother    Cancer Mother        breast   Arthritis Mother    Diabetes Father    Cancer Maternal Grandmother        died of lung cancer, hx/o breast cancer   Stroke Maternal Aunt    Hypertension Other        throughout both sides of family   Heart disease Neg Hx    was reviewed  Social History Social History   Tobacco Use   Smoking status: Never   Smokeless tobacco: Never  Vaping Use   Vaping Use: Never used  Substance Use Topics   Alcohol use: Yes    Alcohol/week: 12.0 standard drinks of alcohol    Types: 12 Cans of beer per week   Drug use: No    Allergies  Allergen Reactions   Asa Buff (Mag [Buffered Aspirin] Other (See Comments)    GI upset----was also taking BC Powders    Glucophage [Metformin Hydrochloride] Rash    Rash?   Ibuprofen Other (See Comments)    Upset stomach   Lipitor [Atorvastatin Calcium] Other (See Comments)    Myalgias and arthralgias   Prilosec [Omeprazole Magnesium] Other (See Comments)    GI upset    Current Outpatient Medications  Medication Sig Dispense Refill   aspirin EC 81 MG tablet Take 1 tablet (81 mg total) by mouth daily with breakfast. Please take Aspirin 81 mg daily along with Plavix 75 mg daily for 21 days then after that STOP the Plavix  and continue ONLY Aspirin 81 mg daily indefinitely--for stroke Prevention 30 tablet 11    atorvastatin (LIPITOR) 20 MG tablet Take 1 tablet (20 mg total) by mouth daily. For stroke prevention 30 tablet 11   clopidogrel (PLAVIX) 75 MG tablet Take 1 tablet (75 mg total) by mouth daily. Please take Aspirin 81 mg daily along with Plavix 75 mg daily for 21 days then after that STOP the Plavix  and continue ONLY Aspirin 81 mg daily indefinitely--for stroke Prevention 30 tablet 3   Clopidogrel Bisulfate (PLAVIX  PO) Plavix     Dulaglutide (TRULICITY) 1.5 MG/0.5ML SOPN INJECT 1.5 MG SUBCUTANEOUSLY ONCE A WEEK     gabapentin (NEURONTIN) 100 MG capsule Take 1 capsule (100 mg total) by mouth 3 (three) times daily. 90 capsule 2   glipiZIDE (GLUCOTROL) 10 MG tablet Take by mouth.     hydrochlorothiazide (HYDRODIURIL) 25 MG tablet 1 tablet in the morning Orally Once a day     lisinopril (ZESTRIL) 20 MG tablet Take by mouth.     metFORMIN (GLUCOPHAGE-XR) 500 MG 24 hr tablet Take 500 mg by mouth 2 (two) times daily with a meal.     pantoprazole (PROTONIX) 40 MG tablet Take 1 tablet (40 mg total) by mouth daily. 30 tablet 5   Semaglutide, 1 MG/DOSE, (OZEMPIC, 1 MG/DOSE,) 4 MG/3ML SOPN Inject 1 mg into the skin once a week.     sildenafil (VIAGRA) 50 MG tablet Take 50 mg by mouth daily as needed for erectile dysfunction.     tamsulosin (FLOMAX) 0.4 MG CAPS capsule Take 0.4 mg by mouth daily.     tiZANidine (ZANAFLEX) 4 MG tablet Take 1 tablet (4 mg total) by mouth daily. 30 tablet 1   No current facility-administered medications for this visit.     Physical Exam BP (!) 152/86   Pulse 90   Ht 5\' 10"  (1.778 m)   Wt 168 lb (76.2 kg)   BMI 24.11 kg/m   Gen. appearance: The patient is well-developed and well-nourished grooming and hygiene are normal The patient is oriented to person place and time The patient's mood is normal and the affect is normal   Gait assessment: The patient stands with  normal gait and station  Lumbar spine Tenderness  to palpation is noted in the lower L4-5 and 5 S1  segment  Range of motion flexion increases the pain extension does not rotation to the right and lateral bending to the right causes mild discomfort rotation to the left and lateral bend left no discomfort Muscle tone  normal on the right and left sides of the spine  Lower extremities right and left Normal range of motion hip knee and ankle All 3 joints are reduced and stable  Strength right lower extremity L2-S1 NORMAL Strength left lower extremity L2-S1 NORMAL  Neurologic right lower extremity examination  Reflexes were 1+ and equal at the knee and 1+ and equal at the ankle    Sensation was normal in both feet and legs    Babinski's tests were down going  Straight leg raise testing   The vascular examination revealed normal dorsalis pedis pulses in both feet and both feet were warm with good capillary refill

## 2022-05-23 NOTE — Patient Instructions (Addendum)
Physical therapy has been ordered for you at [facility]. They should call you to schedule, [phone] is the phone number to call, if you want to call to schedule.   Take extra strength Tylenol 500 mg 4 times a day  Take the muscle relaxer tizanidine as prescribed  Take the gabapentin 100 mg 3 times a day

## 2022-05-23 NOTE — Progress Notes (Deleted)
b

## 2022-05-28 ENCOUNTER — Telehealth: Payer: Self-pay | Admitting: Orthopedic Surgery

## 2022-05-28 DIAGNOSIS — M5136 Other intervertebral disc degeneration, lumbar region: Secondary | ICD-10-CM

## 2022-05-28 DIAGNOSIS — M545 Low back pain, unspecified: Secondary | ICD-10-CM

## 2022-05-28 NOTE — Telephone Encounter (Signed)
Sent to [facility] I advised patient when he was in the office would send there if not available at [facility]

## 2022-05-28 NOTE — Telephone Encounter (Signed)
Dr. Mort Sawyers pt - spoke w/the patient, he stated that Dr. Romeo Chamblin sent him for PT in [place], but they can't get him in until 07/09/22.  He stated that Dr. Romeo Lamadrid told him to call back to see if we could get him in sooner elsewhere.

## 2022-07-09 ENCOUNTER — Ambulatory Visit (HOSPITAL_COMMUNITY): Payer: BC Managed Care – PPO | Admitting: Physical Therapy

## 2022-09-08 ENCOUNTER — Other Ambulatory Visit (INDEPENDENT_AMBULATORY_CARE_PROVIDER_SITE_OTHER): Payer: BC Managed Care – PPO

## 2022-09-08 ENCOUNTER — Encounter: Payer: Self-pay | Admitting: Orthopedic Surgery

## 2022-09-08 ENCOUNTER — Ambulatory Visit: Payer: BC Managed Care – PPO | Admitting: Orthopedic Surgery

## 2022-09-08 VITALS — BP 127/86 | HR 92 | Ht 70.0 in | Wt 170.0 lb

## 2022-09-08 DIAGNOSIS — R1031 Right lower quadrant pain: Secondary | ICD-10-CM

## 2022-09-08 DIAGNOSIS — M5136 Other intervertebral disc degeneration, lumbar region: Secondary | ICD-10-CM

## 2022-09-08 MED ORDER — TRAMADOL-ACETAMINOPHEN 37.5-325 MG PO TABS
1.0000 | ORAL_TABLET | ORAL | 0 refills | Status: AC | PRN
Start: 2022-09-08 — End: 2022-09-13

## 2022-09-08 NOTE — Patient Instructions (Addendum)
Plan  Continue to take the tizanidine  I have changed your medication for pain to Ultracet 1 every 6 hours as needed  You will have an MRI of your back I will call you with results and then we will make a referral to Dr. Christell Constant for further management of this condition  While we are working on your approval for MRI please go ahead and call to schedule your appointment with [facility] within at least one (1) week.   Central Scheduling [phone]

## 2022-09-08 NOTE — Progress Notes (Signed)
   VISIT TYPE: FOLLOW UP   Chief Complaint  Patient presents with   Back Pain    Lower right back pain that radiates to groin and down leg in the thigh area.  Had physical therapy 6 weeks or more  he believes his strength is better now than 1 year ago when he had his stroke   he said he can not pin point what makes it hurt worse     Encounter Diagnoses  Name Primary?   DDD (degenerative disc disease), lumbar Yes   Right groin pain     Assessment and Plan: 52 year old male who is on Plavix no improvement after physical therapy and medical management recommend referral after MRI  He is on Plavix he cannot take anti-inflammatories Tylenol and tizanidine are not controlling his pain we tried some Neurontin as well no improvement  Recommend Ultracet for pain   Prior treatment:     Physical therapy, tizanidine, Tylenol Extra Strength, gabapentin prednisone  After physical therapy patient still having back pain which radiates into groin and anterior thigh  Therefore I am going to evaluate his right hip  Last office visit: Presents to [place] for evaluation of back pain for 2 months.  Seems to be located on the right side of the lower back area with no history of injury  No surgery.  He is on Plavix  He did try some prednisone and elevated his glucose did not provide relief right flank history none pain does not radiate to the foot pain radiates to the right thigh and below the knee          BP 127/86   Pulse 92   Ht 5\' 10"  (1.778 m)   Wt 170 lb (77.1 kg)   BMI 24.39 kg/m   Right Hip Exam  Right hip exam is normal.    Back Exam   Tenderness  The patient is experiencing tenderness in the lumbar.  Other  Gait: normal        Imaging see dictated report mild osteoarthritis no evidence of AVN or narrowing of the joint just some cysts around the acetabulum and what may be best described as decreased femoral head neck offset on the frog lateral.  Dedicated crosstable  lateral may be necessary to adequately evaluate FAI  A/P Encounter Diagnoses  Name Primary?   DDD (degenerative disc disease), lumbar Yes   Right groin pain    52 year old male has had medical management and physical therapy for back pain with radicular symptoms into his right.  His right hip x-ray and exam were normal  Recommend MRI of the lumbar spine and referral to Dr. Christell Constant for further treatment.  He is on Plavix complicating the treatment for pain control.    Meds ordered this encounter  Medications   traMADol-acetaminophen (ULTRACET) 37.5-325 MG tablet    Sig: Take 1 tablet by mouth every 4 (four) hours as needed for up to 5 days.    Dispense:  30 tablet    Refill:  0

## 2022-09-17 ENCOUNTER — Ambulatory Visit (HOSPITAL_COMMUNITY)
Admission: RE | Admit: 2022-09-17 | Discharge: 2022-09-17 | Disposition: A | Discharge status: Home / Self Care | Payer: BC Managed Care – PPO | Source: Ambulatory Visit | Attending: Orthopedic Surgery | Admitting: Orthopedic Surgery

## 2022-09-17 DIAGNOSIS — M5136 Other intervertebral disc degeneration, lumbar region: Secondary | ICD-10-CM | POA: Insufficient documentation

## 2022-10-02 ENCOUNTER — Ambulatory Visit: Payer: BC Managed Care – PPO | Admitting: Orthopedic Surgery

## 2022-10-02 ENCOUNTER — Encounter: Payer: Self-pay | Admitting: Orthopedic Surgery

## 2022-10-02 DIAGNOSIS — M48061 Spinal stenosis, lumbar region without neurogenic claudication: Secondary | ICD-10-CM

## 2022-10-02 NOTE — Progress Notes (Signed)
Follow-up for MRI results  The patient complains of ongoing back pain  As noted previously:  Lower right back pain that radiates to groin and down leg in the thigh area.  Had physical therapy 6 weeks or more  he believes his strength is better now than 1 year ago when he had his stroke   he said he can not pin point what makes it hurt worse      His MRI is recorded in the chart under radiology but basically has degenerative disc disease with foraminal and spinal canal stenosis  I reviewed the results with the patient with a model and pictures and he will be referred to a spine specialist for further care

## 2022-10-02 NOTE — Patient Instructions (Signed)
We are referring you to [facility] from [facility] address is [address] [place] The phone number is [phone]  The office will call you with an appointment Dr. Christell Constant

## 2022-11-07 ENCOUNTER — Other Ambulatory Visit (INDEPENDENT_AMBULATORY_CARE_PROVIDER_SITE_OTHER): Payer: Self-pay

## 2022-11-07 ENCOUNTER — Ambulatory Visit: Payer: BC Managed Care – PPO | Admitting: Orthopedic Surgery

## 2022-11-07 ENCOUNTER — Encounter: Payer: Self-pay | Admitting: Orthopedic Surgery

## 2022-11-07 VITALS — BP 150/88 | HR 81 | Ht 70.0 in | Wt 170.0 lb

## 2022-11-07 DIAGNOSIS — M48061 Spinal stenosis, lumbar region without neurogenic claudication: Secondary | ICD-10-CM | POA: Diagnosis not present

## 2022-11-07 DIAGNOSIS — M5416 Radiculopathy, lumbar region: Secondary | ICD-10-CM

## 2022-11-07 DIAGNOSIS — M545 Low back pain, unspecified: Secondary | ICD-10-CM

## 2022-11-07 NOTE — Progress Notes (Signed)
Orthopedic Spine Surgery Office Note  Assessment: Patient is a 52 y.o. male with low back pain that radiates into the right anterior thigh.  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 Discussed that the conservative treatments include:  -activity modification  -physical therapy  -over the counter pain medications  -medrol dosepak  -lumbar steroid injections -Patient has tried PT, Tylenol -Recommended diagnostic/therapeutic right sided L3/4 injection Would need a new A1c and an A1c of 7.5 or less prior to elective spine surgery -Patient is to return to the office in 4 weeks, x-rays needed at next visit: none   Patient expressed understanding of the plan and all questions were answered to the patient's satisfaction.   ___________________________________________________________________________   History:  Patient is a 52 y.o. male who presents today for lumbar spine.  Patient has several years of low back pain that radiates into the right anterior thigh that has gotten progressively worse with time.  There was no trauma or injury that preceded the onset of pain.  He is not having any pain radiating into the left lower extremity.  Has neuropathy with numbness and paresthesias in bilateral feet but no other numbness or paresthesias.  Feels the pain with activity and rest.   Weakness: Generally feels weak but no specific weakness noted Symptoms of imbalance: Denies Paresthesias and numbness: Yes, has diabetic neuropathy with bilateral numbness/paresthesias in the feet Bowel or bladder incontinence: Denies Saddle anesthesia: Denies  Treatments tried: PT, Tylenol  Review of systems: Denies fevers and chills, night sweats, unexplained weight loss, history of cancer.  Has had pain that wakes him at night  Past  medical history: History of stroke Diabetes (last A1c was 6.1 on 08/23/2021) Diabetic retinopathy Diabetic neuropathy HTN  Allergies: ibuprofen, lipitor, prilosec  Past surgical history:  Hernia repair (x2) Cholecystectomy  Social history: Denies use of nicotine product (smoking, vaping, patches, smokeless) Alcohol use: denies Denies recreational drug use   Physical Exam:  BMI of 24.4  General: no acute distress, appears stated age Neurologic: alert, answering questions appropriately, following commands Respiratory: unlabored breathing on room air, symmetric chest rise Psychiatric: appropriate affect, normal cadence to speech   MSK (spine):  -Strength exam      Left  Right EHL    5/5  5/5 TA    5/5  5/5 GSC    5/5  5/5 Knee extension  5/5  5/5 Hip flexion   5/5  5/5  -Sensory exam    Sensation intact to light touch in L3-S1 nerve distributions of bilateral lower extremities  -Achilles DTR: 1/4 on the left, 1/4 on the right -Patellar tendon DTR: 1/4 on the left, 1/4 on the right  -Straight leg raise: Negative bilaterally -Femoral nerve stretch test: Negative bilaterally -Clonus: no beats bilaterally  -Left hip exam: No pain through range of motion, negative Stinchfield, negative FABER -Right hip exam: No pain through range of motion, negative Stinchfield, negative FABER  Imaging: XRs of the lumbar spine from 11/07/2022 were independently reviewed and interpreted, showing no significant degenerative changes.  No fracture or dislocation seen.  No evidence of instability on flexion/extension views.  MRI of the lumbar spine from 09/17/2022 was independently reviewed and interpreted, showing disc desiccation at L3/4.  No other significant DDD.  Foraminal stenosis bilaterally at L3/4 (R>L).  Lateral recess stenosis on the right at L3/4.  No  other significant stenosis seen.   Patient name: Bruce Mora Patient MRN: [id number] Date of visit: 11/07/22

## 2022-11-17 ENCOUNTER — Telehealth: Payer: Self-pay | Admitting: Orthopedic Surgery

## 2022-11-17 NOTE — Telephone Encounter (Signed)
Pt is requesting a phone call in regards to his coming up appt. Best cb [phone]

## 2022-11-17 NOTE — Telephone Encounter (Signed)
I called and got him scheduled with Alvester Morin on 11/27/22 @ 1030, he will have a driver

## 2022-11-27 ENCOUNTER — Other Ambulatory Visit: Payer: Self-pay

## 2022-11-27 ENCOUNTER — Ambulatory Visit: Payer: BC Managed Care – PPO | Admitting: Physical Medicine and Rehabilitation

## 2022-11-27 DIAGNOSIS — M5416 Radiculopathy, lumbar region: Secondary | ICD-10-CM

## 2022-11-27 MED ORDER — METHYLPREDNISOLONE ACETATE 40 MG/ML IJ SUSP
40.0000 mg | Freq: Once | INTRAMUSCULAR | Status: AC
Start: 2022-11-27 — End: 2022-11-27
  Administered 2022-11-27: 40 mg

## 2022-11-27 NOTE — Progress Notes (Signed)
Functional Pain Scale - descriptive words and definitions  Distracting (5)    Aware of pain/able to complete some ADL's but limited by pain/sleep is affected and active distractions are only slightly useful. Moderate range order  Average Pain 5  132/79 Throbbing pain no numbness or tingling  +Driver, -BT, -Dye Allergies.

## 2022-11-27 NOTE — Patient Instructions (Signed)

## 2022-12-05 ENCOUNTER — Ambulatory Visit: Payer: BC Managed Care – PPO | Admitting: Orthopedic Surgery

## 2022-12-05 DIAGNOSIS — M5416 Radiculopathy, lumbar region: Secondary | ICD-10-CM | POA: Diagnosis not present

## 2022-12-05 NOTE — Progress Notes (Signed)
Orthopedic Spine Surgery Office Note   Assessment: Patient is a 52 y.o. male with low back pain that radiates into the right anterior thigh.  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 Could repeat this if he gets lasting relief with this injection -Would need a new A1c and an A1c of 7.5 or less prior to elective spine surgery -Patient is to return to the office on an as needed basis     Patient expressed understanding of the plan and all questions were answered to the patient's satisfaction.    ___________________________________________________________________________     History:   Patient is a 52 y.o. male who presents today for follow up on his lumbar spine. Patient has had low back pain that radiates into his right anterior thigh. After our last visit, he got an injection. He noted 75% relief with that injection. He feels he is able to do more as a result. He is happy with the injection result. He said he can tolerate his current amount of pain. Still not having any pain radiating into the left lower extremity. Denies paresthesias and numbness. Has not developed any new symptoms since he was last seen.    Treatments tried: PT, Tylenol, lumbar steroid injection     Physical Exam:   General: no acute distress, appears stated age Neurologic: alert, answering questions appropriately, following commands Respiratory: unlabored breathing on room air, symmetric chest rise Psychiatric: appropriate affect, normal cadence to speech     MSK (spine):   -Strength exam                                                   Left                  Right EHL                              5/5                  5/5 TA                                 5/5                  5/5 GSC                             5/5                  5/5 Knee extension             5/5                  5/5 Hip flexion                    5/5                  5/5   -Sensory exam  Sensation intact to light touch in L3-S1 nerve distributions of bilateral lower extremities   -Achilles DTR: 1/4 on the left, 1/4 on the right -Patellar tendon DTR: 1/4 on the left, 1/4 on the right   -Straight leg raise: Negative bilaterally -Femoral nerve stretch test: Negative bilaterally -Clonus: no beats bilaterally   -Left hip exam: No pain through range of motion, negative Stinchfield, negative FABER -Right hip exam: No pain through range of motion, negative Stinchfield, negative FABER   Imaging: XRs of the lumbar spine from 11/07/2022 were previously independently reviewed and interpreted, showing no significant degenerative changes.  No fracture or dislocation seen.  No evidence of instability on flexion/extension views.   MRI of the lumbar spine from 09/17/2022 was previously independently reviewed and interpreted, showing disc desiccation at L3/4.  No other significant DDD.  Foraminal stenosis bilaterally at L3/4 (R>L).  Lateral recess stenosis on the right at L3/4.  No other significant stenosis seen.     Patient name: Bruce Mora Patient MRN: [id number] Date of visit: 12/05/22

## 2022-12-08 NOTE — Progress Notes (Signed)
PAYCE DIETZLER - 52 y.o. male MRN [id number]  Date of birth: 01/22/1970  Office Visit Note: Visit Date: 11/27/2022 PCP: [facility] Referred by: London Sheer, MD  Subjective: Chief Complaint  Patient presents with   Lower Back - Pain   HPI:  NILO KALSCHEUR is a 52 y.o. male who comes in today at the request of Dr. Willia Craze for planned Right L3-4 Lumbar Transforaminal epidural steroid injection with fluoroscopic guidance.  The patient has failed conservative care including home exercise, medications, time and activity modification.  This injection will be diagnostic and hopefully therapeutic.  Please see requesting physician notes for further details and justification.   ROS Otherwise per HPI.  Assessment & Plan: Visit Diagnoses:    ICD-10-CM   1. Lumbar radiculopathy  M54.16 XR C-ARM NO REPORT    Epidural Steroid injection    methylPREDNISolone acetate (DEPO-MEDROL) injection 40 mg      Plan: No additional findings.   Meds & Orders:  Meds ordered this encounter  Medications   methylPREDNISolone acetate (DEPO-MEDROL) injection 40 mg    Orders Placed This Encounter  Procedures   XR C-ARM NO REPORT   Epidural Steroid injection    Follow-up: Return for visit to requesting provider as needed.   Procedures: No procedures performed  Lumbosacral Transforaminal Epidural Steroid Injection - Sub-Pedicular Approach with Fluoroscopic Guidance  Patient: HANNIBAL BROOKENS      Date of Birth: 08/16/1970 MRN: [id number] PCP: [facility]      Visit Date: 11/27/2022   Universal Protocol:    Date/Time: 11/27/2022  Consent Given By: the patient  Position: PRONE  Additional Comments: Vital signs were monitored before and after the procedure. Patient was prepped and draped in the usual sterile fashion. The correct patient, procedure, and site was verified.   Injection Procedure Details:   Procedure diagnoses:  Lumbar radiculopathy [M54.16]    Meds Administered:  Meds ordered this encounter  Medications   methylPREDNISolone acetate (DEPO-MEDROL) injection 40 mg    Laterality: Right  Location/Site: L3  Needle:5.0 in., 22 ga.  Short bevel or Quincke spinal needle  Needle Placement: Transforaminal  Findings:    -Comments: Excellent flow of contrast along the nerve, nerve root and into the epidural space.  Procedure Details: After squaring off the end-plates to get a true AP view, the C-arm was positioned so that an oblique view of the foramen as noted above was visualized. The target area is just inferior to the "nose of the scotty dog" or sub pedicular. The soft tissues overlying this structure were infiltrated with 2-3 ml. of 1% Lidocaine without Epinephrine.  The spinal needle was inserted toward the target using a "trajectory" view along the fluoroscope beam.  Under AP and lateral visualization, the needle was advanced so it did not puncture dura and was located close the 6 O'Clock position of the pedical in AP tracterory. Biplanar projections were used to confirm position. Aspiration was confirmed to be negative for CSF and/or blood. A 1-2 ml. volume of Isovue-250 was injected and flow of contrast was noted at each level. Radiographs were obtained for documentation purposes.   After attaining the desired flow of contrast documented above, a 0.5 to 1.0 ml test dose of 0.25% Marcaine was injected into each respective transforaminal space.  The patient was observed for 90 seconds post injection.  After no sensory deficits were reported, and normal lower extremity motor function was noted,  the above injectate was administered so that equal amounts of the injectate were placed at each foramen (level) into the transforaminal epidural space.   Additional Comments:  No complications occurred Dressing: 2 x 2 sterile gauze and Band-Aid    Post-procedure details: Patient was observed during the  procedure. Post-procedure instructions were reviewed.  Patient left the clinic in stable condition.    Clinical History: MRI LUMBAR SPINE WITHOUT CONTRAST   TECHNIQUE: Multiplanar, multisequence MR imaging of the lumbar spine was performed. No intravenous contrast was administered.   COMPARISON:  None Available.   FINDINGS: Segmentation: Presumed standard anatomy with the inferior-most well developed disc space designated as L5-S1.   Alignment:  Physiologic.   Vertebrae: No acute fracture. No evidence of discitis. Diffuse marrow heterogeneity without discrete marrow replacing lesion. Findings are nonspecific but can be seen in the setting of chronic anemia, smoking, and/or obesity.   Conus medullaris and cauda equina: Conus extends to the L1-2 level. Conus and cauda equina appear normal.   Paraspinal and other soft tissues: Negative.   Disc levels:   L1-L2: No significant disc protrusion, foraminal stenosis, or canal stenosis.   L2-L3: Minimal annular disc bulge. No significant canal stenosis. Mild bilateral foraminal stenosis.   L3-L4: Disc desiccation and height loss with annular disc bulge. Borderline-mild canal stenosis with moderate bilateral foraminal stenosis, right worse than left.   L4-L5: Mild annular disc bulge. No canal stenosis. Moderate left and mild right foraminal stenosis.   L5-S1: Mild annular disc bulge. No canal stenosis. Mild bilateral foraminal stenosis.   IMPRESSION: 1. Multilevel lumbar spondylosis, most pronounced at L3-L4 where there is borderline-mild canal stenosis and moderate bilateral foraminal stenosis. 2. Moderate left foraminal stenosis at L4-L5.     Electronically Signed   By: Duanne Guess D.O.   On: 10/02/2022 11:12     Objective:  VS:  HT:    WT:   BMI:     BP:   HR: bpm  TEMP: ( )  RESP:  Physical Exam Vitals and nursing note reviewed.  Constitutional:      General: He is not in acute distress.     Appearance: Normal appearance. He is not ill-appearing.  HENT:     Head: Normocephalic and atraumatic.     Right Ear: External ear normal.     Left Ear: External ear normal.     Nose: No congestion.  Eyes:     Extraocular Movements: Extraocular movements intact.  Cardiovascular:     Rate and Rhythm: Normal rate.     Pulses: Normal pulses.  Pulmonary:     Effort: Pulmonary effort is normal. No respiratory distress.  Abdominal:     General: There is no distension.     Palpations: Abdomen is soft.  Musculoskeletal:        General: No tenderness or signs of injury.     Cervical back: Neck supple.     Right lower leg: No edema.     Left lower leg: No edema.     Comments: Patient has good distal strength without clonus.  Skin:    Findings: No erythema or rash.  Neurological:     General: No focal deficit present.     Mental Status: He is alert and oriented to person, place, and time.     Sensory: No sensory deficit.     Motor: No weakness or abnormal muscle tone.     Coordination: Coordination normal.  Psychiatric:  Mood and Affect: Mood normal.        Behavior: Behavior normal.      Imaging: No results found.

## 2022-12-08 NOTE — Procedures (Signed)
Lumbosacral Transforaminal Epidural Steroid Injection - Sub-Pedicular Approach with Fluoroscopic Guidance  Patient: Bruce Mora      Date of Birth: 03-Mar-1970 MRN: [id number] PCP: [facility]      Visit Date: 11/27/2022   Universal Protocol:    Date/Time: 11/27/2022  Consent Given By: the patient  Position: PRONE  Additional Comments: Vital signs were monitored before and after the procedure. Patient was prepped and draped in the usual sterile fashion. The correct patient, procedure, and site was verified.   Injection Procedure Details:   Procedure diagnoses: Lumbar radiculopathy [M54.16]    Meds Administered:  Meds ordered this encounter  Medications   methylPREDNISolone acetate (DEPO-MEDROL) injection 40 mg    Laterality: Right  Location/Site: L3  Needle:5.0 in., 22 ga.  Short bevel or Quincke spinal needle  Needle Placement: Transforaminal  Findings:    -Comments: Excellent flow of contrast along the nerve, nerve root and into the epidural space.  Procedure Details: After squaring off the end-plates to get a true AP view, the C-arm was positioned so that an oblique view of the foramen as noted above was visualized. The target area is just inferior to the "nose of the scotty dog" or sub pedicular. The soft tissues overlying this structure were infiltrated with 2-3 ml. of 1% Lidocaine without Epinephrine.  The spinal needle was inserted toward the target using a "trajectory" view along the fluoroscope beam.  Under AP and lateral visualization, the needle was advanced so it did not puncture dura and was located close the 6 O'Clock position of the pedical in AP tracterory. Biplanar projections were used to confirm position. Aspiration was confirmed to be negative for CSF and/or blood. A 1-2 ml. volume of Isovue-250 was injected and flow of contrast was noted at each level. Radiographs were obtained for documentation purposes.   After  attaining the desired flow of contrast documented above, a 0.5 to 1.0 ml test dose of 0.25% Marcaine was injected into each respective transforaminal space.  The patient was observed for 90 seconds post injection.  After no sensory deficits were reported, and normal lower extremity motor function was noted,   the above injectate was administered so that equal amounts of the injectate were placed at each foramen (level) into the transforaminal epidural space.   Additional Comments:  No complications occurred Dressing: 2 x 2 sterile gauze and Band-Aid    Post-procedure details: Patient was observed during the procedure. Post-procedure instructions were reviewed.  Patient left the clinic in stable condition.

## 2023-03-23 ENCOUNTER — Other Ambulatory Visit (HOSPITAL_COMMUNITY): Payer: Self-pay | Admitting: Nurse Practitioner

## 2023-03-23 DIAGNOSIS — R61 Generalized hyperhidrosis: Secondary | ICD-10-CM

## 2023-04-01 ENCOUNTER — Ambulatory Visit (HOSPITAL_COMMUNITY)
Admission: RE | Admit: 2023-04-01 | Discharge: 2023-04-01 | Disposition: A | Discharge status: Home / Self Care | Source: Ambulatory Visit | Attending: Nurse Practitioner | Admitting: Nurse Practitioner

## 2023-04-01 DIAGNOSIS — R61 Generalized hyperhidrosis: Secondary | ICD-10-CM | POA: Insufficient documentation

## 2023-04-02 ENCOUNTER — Ambulatory Visit (HOSPITAL_COMMUNITY)

## 2023-04-02 ENCOUNTER — Encounter (HOSPITAL_COMMUNITY): Payer: Self-pay

## 2023-05-09 ENCOUNTER — Encounter (HOSPITAL_COMMUNITY): Payer: Self-pay

## 2023-05-09 ENCOUNTER — Emergency Department (HOSPITAL_COMMUNITY)
Admission: EM | Admit: 2023-05-09 | Discharge: 2023-05-09 | Disposition: A | Discharge status: Home / Self Care | Attending: Emergency Medicine | Admitting: Emergency Medicine

## 2023-05-09 ENCOUNTER — Emergency Department (HOSPITAL_COMMUNITY)

## 2023-05-09 ENCOUNTER — Other Ambulatory Visit: Payer: Self-pay

## 2023-05-09 DIAGNOSIS — Z7984 Long term (current) use of oral hypoglycemic drugs: Secondary | ICD-10-CM | POA: Diagnosis not present

## 2023-05-09 DIAGNOSIS — I10 Essential (primary) hypertension: Secondary | ICD-10-CM | POA: Insufficient documentation

## 2023-05-09 DIAGNOSIS — M5442 Lumbago with sciatica, left side: Secondary | ICD-10-CM | POA: Diagnosis not present

## 2023-05-09 DIAGNOSIS — E119 Type 2 diabetes mellitus without complications: Secondary | ICD-10-CM | POA: Insufficient documentation

## 2023-05-09 DIAGNOSIS — M25512 Pain in left shoulder: Secondary | ICD-10-CM | POA: Diagnosis not present

## 2023-05-09 DIAGNOSIS — M5412 Radiculopathy, cervical region: Secondary | ICD-10-CM | POA: Insufficient documentation

## 2023-05-09 DIAGNOSIS — Z79899 Other long term (current) drug therapy: Secondary | ICD-10-CM | POA: Diagnosis not present

## 2023-05-09 DIAGNOSIS — M545 Low back pain, unspecified: Secondary | ICD-10-CM | POA: Diagnosis present

## 2023-05-09 LAB — COMPREHENSIVE METABOLIC PANEL WITH GFR
ALT: 50 U/L — ABNORMAL HIGH (ref 0–44)
AST: 29 U/L (ref 15–41)
Albumin: 3.9 g/dL (ref 3.5–5.0)
Alkaline Phosphatase: 55 U/L (ref 38–126)
Anion gap: 8 (ref 5–15)
BUN: 16 mg/dL (ref 6–20)
CO2: 29 mmol/L (ref 22–32)
Calcium: 9.4 mg/dL (ref 8.9–10.3)
Chloride: 102 mmol/L (ref 98–111)
Creatinine, Ser: 0.7 mg/dL (ref 0.61–1.24)
GFR, Estimated: >60 mL/min (ref >60)
Glucose, Bld: 101 mg/dL — ABNORMAL HIGH (ref 70–99)
Potassium: 4.6 mmol/L (ref 3.5–5.1)
Sodium: 139 mmol/L (ref 135–145)
Total Bilirubin: 0.8 mg/dL (ref 0.0–1.2)
Total Protein: 6.8 g/dL (ref 6.5–8.1)

## 2023-05-09 LAB — TROPONIN I (HIGH SENSITIVITY)
Troponin I (High Sensitivity): 3 ng/L (ref <18)
Troponin I (High Sensitivity): 4 ng/L (ref <18)

## 2023-05-09 LAB — CBC WITH DIFFERENTIAL/PLATELET
Abs Immature Granulocytes: 0.01 10*3/uL (ref 0.00–0.07)
Basophils Absolute: 0 10*3/uL (ref 0.0–0.1)
Basophils Relative: 0 %
Eosinophils Absolute: 0.1 10*3/uL (ref 0.0–0.5)
Eosinophils Relative: 2 %
HCT: 37.4 % — ABNORMAL LOW (ref 39.0–52.0)
Hemoglobin: 12.3 g/dL — ABNORMAL LOW (ref 13.0–17.0)
Immature Granulocytes: 0 %
Lymphocytes Relative: 24 %
Lymphs Abs: 0.9 10*3/uL (ref 0.7–4.0)
MCH: 31.1 pg (ref 26.0–34.0)
MCHC: 32.9 g/dL (ref 30.0–36.0)
MCV: 94.4 fL (ref 80.0–100.0)
Monocytes Absolute: 0.4 10*3/uL (ref 0.1–1.0)
Monocytes Relative: 11 %
Neutro Abs: 2.4 10*3/uL (ref 1.7–7.7)
Neutrophils Relative %: 63 %
Platelets: 205 10*3/uL (ref 150–400)
RBC: 3.96 MIL/uL — ABNORMAL LOW (ref 4.22–5.81)
RDW: 13.2 % (ref 11.5–15.5)
WBC: 3.9 10*3/uL — ABNORMAL LOW (ref 4.0–10.5)
nRBC: 0 % (ref 0.0–0.2)

## 2023-05-09 LAB — CK: Total CK: 135 U/L (ref 49–397)

## 2023-05-09 MED ORDER — DIAZEPAM 5 MG PO TABS
5.0000 mg | ORAL_TABLET | Freq: Once | ORAL | Status: AC
Start: 2023-05-09 — End: 2023-05-09
  Administered 2023-05-09: 5 mg via ORAL
  Filled 2023-05-09: qty 1

## 2023-05-09 MED ORDER — OXYCODONE-ACETAMINOPHEN 5-325 MG PO TABS
1.0000 | ORAL_TABLET | Freq: Once | ORAL | Status: AC
  Administered 2023-05-09: 1 via ORAL
  Filled 2023-05-09: qty 1

## 2023-05-09 MED ORDER — CYCLOBENZAPRINE HCL 5 MG PO TABS: 5.0000 mg | ORAL_TABLET | Freq: Three times a day (TID) | ORAL | 0 refills | Status: AC | PRN

## 2023-05-09 MED ORDER — KETOROLAC TROMETHAMINE 15 MG/ML IJ SOLN
15.0000 mg | Freq: Once | INTRAMUSCULAR | Status: AC
  Administered 2023-05-09: 15 mg via INTRAVENOUS
  Filled 2023-05-09: qty 1

## 2023-05-09 MED ORDER — IBUPROFEN 800 MG PO TABS: 800.0000 mg | ORAL_TABLET | Freq: Three times a day (TID) | ORAL | 0 refills | Status: AC | PRN

## 2023-05-09 MED ORDER — PREDNISONE 50 MG PO TABS
50.0000 mg | ORAL_TABLET | Freq: Once | ORAL | Status: AC
  Administered 2023-05-09: 50 mg via ORAL
  Filled 2023-05-09: qty 1

## 2023-05-09 MED ORDER — PREDNISONE 10 MG (21) PO TBPK: ORAL_TABLET | Freq: Every day | ORAL | 0 refills | Status: AC

## 2023-05-09 NOTE — ED Notes (Addendum)
 Pt brought in for back, leg, and shoulder pain all on the left side. Pain started about a week and has gradually got worse. Pt has taken otc tylenol  with no improvement.

## 2023-05-09 NOTE — ED Triage Notes (Addendum)
 Patient coming from home, complains of pain in his back, left shoulder, arm, and leg that has been going on since Monday. Patient also is experiencing weakness to the point of falling on his left side. 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

## 2023-05-09 NOTE — Discharge Instructions (Addendum)
 I will that your pain is likely secondary to pinched nerve, in your back, and your neck.  Please follow-up with orthopedics, as instructed above, and you will likely need to have an injection.  Take the medication as prescribed, return if your symptoms worsen.  I have started you on steroids to help with inflammation of the nerve, this may take some time to get better, and may raise your sugars.  Please follow-up with your primary care doctor for further evaluation

## 2023-05-09 NOTE — ED Provider Notes (Signed)
  EMERGENCY DEPARTMENT AT [facility] Provider Note   CSN: [id number] Arrival date & time: 05/09/23  0900     History  Chief Complaint  Patient presents with   Back Pain   Leg Pain   Arm Pain   Shoulder Pain    Bruce Mora is a 53 y.o. male, history of hypertension, hyperlipidemia, diabetes, lumbar radiculopathy, who presents to the ED secondary to weeks worth, left shoulder pain, low back pain, left leg pain.  He states that about a week ago, around Easter, he started having some pain in his left neck, radiating down his left shoulder, he felt numbness and tingling that was worse when moving his arm, and that this arm has been a little bit weaker since.  He states he has a pain, thus he does not feel like he has the strength.  He has not had any chest pain or shortness of breath.  He denies any new upper back pain, or abdominal pain.  States the pain is sharp and stabbing, worse with activity.  Also states that he has not had any recent trauma, but is a IT sales professional, and has a fairly labor-intensive job.  But has not had any recent worsening heavy lifting.   Notes since then, he has also been having some problems with his lower back, he states he has got the numbness/tingling, in his left leg, and that it radiates down his left leg.  Feels like a sharp stabbing pain, in his buttocks, radiating down his left leg.  Denies any kind of anal pain, or rectal pain though.  Has not had a history of any perirectal abscesses or perianal abscesses.  No IV drug use.  Has not had any loss of bowel, bladder, fevers or chills.  States that he is concerned because his atorvastatin  has recently been increased, and he is not sure if that is what is causing it    Home Medications Prior to Admission medications   Medication Sig Start Date End Date Taking? Authorizing Provider  cyclobenzaprine (FLEXERIL) 5 MG tablet Take 1 tablet (5 mg total) by mouth 3 (three) times daily as needed  for muscle spasms. 05/09/23  Yes Telesa Jeancharles L, PA  ibuprofen (ADVIL) 800 MG tablet Take 1 tablet (800 mg total) by mouth every 8 (eight) hours as needed. 05/09/23  Yes Kaelan Amble L, PA  predniSONE (STERAPRED UNI-PAK 21 TAB) 10 MG (21) TBPK tablet Take by mouth daily. Take 5 tabs for 1 days, then 4 tabs for 2 days, then 3 tabs for 2 days, 2 tabs for 2 days, then 1 tab by mouth daily for 2 days 05/09/23  Yes Estefanny Moler L, PA  atorvastatin  (LIPITOR) 20 MG tablet Take 1 tablet (20 mg total) by mouth daily. For stroke prevention 08/23/21 08/23/22  Colin Dawley, MD  clopidogrel  (PLAVIX ) 75 MG tablet Take 1 tablet (75 mg total) by mouth daily. Please take Aspirin  81 mg daily along with Plavix  75 mg daily for 21 days then after that STOP the Plavix   and continue ONLY Aspirin  81 mg daily indefinitely--for stroke Prevention 08/24/21   Colin Dawley, MD  Clopidogrel  Bisulfate (PLAVIX  PO) Plavix     [provider]  gabapentin  (NEURONTIN ) 100 MG capsule Take 1 capsule (100 mg total) by mouth 3 (three) times daily. 05/23/22   Darrin Emerald, MD  glipiZIDE (GLUCOTROL) 10 MG tablet Take by mouth. 02/08/20   [provider]  hydrochlorothiazide (HYDRODIURIL) 25 MG tablet 1 tablet in  the morning Orally Once a day 02/27/20   [provider]  lisinopril (ZESTRIL) 20 MG tablet Take by mouth. 04/11/20   [provider]  metFORMIN  (GLUCOPHAGE -XR) 500 MG 24 hr tablet Take 500 mg by mouth 2 (two) times daily with a meal.    [provider]  OZEMPIC, 2 MG/DOSE, 8 MG/3ML SOPN Inject 2 mg into the skin once a week. 09/26/22   [provider]  pantoprazole  (PROTONIX ) 40 MG tablet Take 1 tablet (40 mg total) by mouth daily. 08/23/21 08/23/22  Colin Dawley, MD  Semaglutide, 1 MG/DOSE, (OZEMPIC, 1 MG/DOSE,) 4 MG/3ML SOPN Inject 1 mg into the skin once a week. 02/04/21   [provider]  sildenafil (VIAGRA) 50 MG tablet Take 50 mg by mouth daily as needed for  erectile dysfunction.    [provider]  tiZANidine  (ZANAFLEX ) 4 MG tablet Take 1 tablet (4 mg total) by mouth daily. 05/23/22 05/23/23  Darrin Emerald, MD      Allergies    Asa buff (mag [buffered aspirin ], Ibuprofen, Lipitor [atorvastatin  calcium ], and Prilosec [omeprazole magnesium]    Review of Systems   Review of Systems  Cardiovascular:  Negative for chest pain.  Musculoskeletal:  Positive for back pain.    Physical Exam Updated Vital Signs BP (!) 147/94 (BP Location: Left Arm)   Pulse 78   Temp 97.9 F (36.6 C) (Oral)   Resp 17   Ht 5\' 10"  (1.778 m)   Wt 74.8 kg   SpO2 99%   BMI 23.68 kg/m  Physical Exam Vitals and nursing note reviewed.  Constitutional:      General: He is not in acute distress.    Appearance: He is well-developed.  HENT:     Head: Normocephalic and atraumatic.  Eyes:     Conjunctiva/sclera: Conjunctivae normal.  Cardiovascular:     Rate and Rhythm: Normal rate and regular rhythm.     Heart sounds: No murmur heard. Pulmonary:     Effort: Pulmonary effort is normal. No respiratory distress.     Breath sounds: Normal breath sounds.  Abdominal:     Palpations: Abdomen is soft.     Tenderness: There is no abdominal tenderness.  Musculoskeletal:        General: No swelling.     Cervical back: Neck supple.     Comments: Tenderness to palpation, of lumbar spine, no midline tenderness.  Tenderness to palpation, left buttocks, and perispinal muscles, the lumbar spine, with positive straight leg raise.  5 out of 5 strength bilateral lower extremities.   Tenderness to palpation, of the left paraspinal muscles, the left cervical spine, with tenderness to palpation, of the trapezius.  Positive empty can test. Positive Spurlings test.  No tenderness to palpation, of scapula, or inferior scapula.  5 out of 5 strength of bilateral upper extremities.  No wounds, crepitus, or rash noted.  Skin:    General: Skin is warm and dry.     Capillary  Refill: Capillary refill takes less than 2 seconds.  Neurological:     Mental Status: He is alert.  Psychiatric:        Mood and Affect: Mood normal.     ED Results / Procedures / Treatments   Labs (all labs ordered are listed, but only abnormal results are displayed) Labs Reviewed  CBC WITH DIFFERENTIAL/PLATELET - Abnormal; Notable for the following components:      Result Value   WBC 3.9 (*)    RBC 3.96 (*)  Hemoglobin 12.3 (*)    HCT 37.4 (*)    All other components within normal limits  COMPREHENSIVE METABOLIC PANEL WITH GFR - Abnormal; Notable for the following components:   Glucose, Bld 101 (*)    ALT 50 (*)    All other components within normal limits  CK  TROPONIN I (HIGH SENSITIVITY)  TROPONIN I (HIGH SENSITIVITY)    EKG EKG Interpretation Date/Time:  Saturday May 09 2023 10:20:47 EDT Ventricular Rate:  80 PR Interval:  158 QRS Duration:  101 QT Interval:  372 QTC Calculation: 430 R Axis:   85  Text Interpretation: Sinus rhythm Left ventricular hypertrophy ST elev, probable normal early repol pattern lead II ST elevation, and likely early repo in anterior leads No significant change since last tracing Confirmed by Deatra Face [phone]) on 05/09/2023 11:05:20 AM  Radiology DG Cervical Spine 2-3 Views Result Date: 05/09/2023 CLINICAL DATA:  53 year old male with left upper extremity numbness, left lower extremity pain. EXAM: CERVICAL SPINE - 2-3 VIEW COMPARISON:  CTA head and neck 08/22/2021. FINDINGS: Chronic straightening of cervical lordosis. Prevertebral soft tissue contours remain normal. Cervicothoracic junction alignment is within normal limits. Normal cervical AP alignment, C1-C2 alignment and joint spaces. Bone mineralization is within normal limits. Chronic disc and endplate degeneration at C5-C6 with anterior predominant endplate spurring appears stable since 2023. Stable mild disc space loss posteriorly at C4-C5. Other disc spaces are preserved.  Negative visible thoracic inlet. No acute osseous abnormality identified. Faint calcified carotid bifurcation atherosclerosis again noted. IMPRESSION: 1. No acute osseous abnormality identified in the cervical spine. 2. Chronic disc and endplate degeneration at C5-C6 appears stable since the 2023 CTA. Subtle disc degeneration at C4-C5. 3. Chronic calcified carotid bifurcation atherosclerosis. Electronically Signed   By: Marlise Simpers M.D.   On: 05/09/2023 10:55   DG Chest 2 View Result Date: 05/09/2023 CLINICAL DATA:  53 year old male with left lower extremity pain, left upper extremity numbness. EXAM: CHEST - 2 VIEW COMPARISON:  Chest CT 04/01/2023 and earlier. FINDINGS: PA and lateral views 1022 hours. Lung volumes and mediastinal contours remain normal. Both lungs appear clear. No pneumothorax or pleural effusion. Stable cholecystectomy clips. No acute osseous abnormality identified. Negative visible bowel gas. IMPRESSION: No acute cardiopulmonary abnormality. Electronically Signed   By: Marlise Simpers M.D.   On: 05/09/2023 10:52    Procedures Procedures    Medications Ordered in ED Medications  oxyCODONE-acetaminophen  (PERCOCET/ROXICET) 5-325 MG per tablet 1 tablet (has no administration in time range)  diazepam (VALIUM) tablet 5 mg (5 mg Oral Given 05/09/23 1055)  ketorolac (TORADOL) 15 MG/ML injection 15 mg (15 mg Intravenous Given 05/09/23 1058)  predniSONE (DELTASONE) tablet 50 mg (50 mg Oral Given 05/09/23 1055)    ED Course/ Medical Decision Making/ A&P                                 Medical Decision Making Patient is a 53 year old male, here for left arm pain, shoulder pain, back pain, and leg pain, has been going on for the last week.  He states started with neck pain, rating down the left arm, that is been going on for a week, constant, worse with movement.  Has tenderness to palpation, left paracervical muscles, and trapezius, on exam, with positive empty can test, as well as Spurling's test.   Concerning for radiculopathy, however given cardiac history will obtain troponin, chest x-ray, believes it may be secondary to  the atorvastatin  increases well, will obtain CK to ensure no rhabdo.  He is overall well-appearing, is not have any back pain, nausea, vomiting, his pain is reproducible on exam, which is overall reassuring.  He has positive straight leg raise, left lower extremity, and known spondylosis, we will hold off on imaging at this time as he has no red flag symptoms.  Will give Valium, and Toradol, as well as prednisone to help with relief  Amount and/or Complexity of Data Reviewed External Data Reviewed:     Details: 09/22/2022 MRI Lumbar w/o  1. Multilevel lumbar spondylosis, most pronounced at L3-L4 where there is borderline-mild canal stenosis and moderate bilateral foraminal stenosis. 2. Moderate left foraminal stenosis at L4-L5. Labs: ordered.    Details: Troponin within normal limits Radiology: ordered.    Details: X-rays unremarkable except for chronic disc and endplate degeneration at C5-C6 Discussion of management or test interpretation with external provider(s): Discussed with patient, his symptoms align with a musculoskeletal complaint, I believe that is likely secondary to radiculopathy, he felt a little bit better after meds, but was given 1 dose of Percocet, for further relief.  He already is established with a orthopedic doctor, instructed him to follow-up with them for further management.  His cardiac enzyme was within normal limits, which is overall reassuring and it has been going on for the past week.  He is well-appearing on exam, and the pain is reproducible, thus suggesting that it is likely musculoskeletal, was secondary to a radiculopathy process.  Discharged with prednisone, Flexeril and ibuprofen.  Informed that prednisone will likely cause increase of sugars, and he voiced understanding.  Discharged home with strict return precautions  Risk Prescription  drug management.    Final Clinical Impression(s) / ED Diagnoses Final diagnoses:  Acute back pain with sciatica, left  Cervical radiculopathy    Rx / DC Orders ED Discharge Orders          Ordered    predniSONE (STERAPRED UNI-PAK 21 TAB) 10 MG (21) TBPK tablet  Daily        05/09/23 1155    cyclobenzaprine (FLEXERIL) 5 MG tablet  3 times daily PRN        05/09/23 1155    ibuprofen (ADVIL) 800 MG tablet  Every 8 hours PRN        05/09/23 1155              Joanne Salah L, PA 05/09/23 1200    Deatra Face, MD 05/10/23 [phone]

## 2023-05-28 ENCOUNTER — Telehealth: Payer: Self-pay | Admitting: Physical Medicine and Rehabilitation

## 2023-05-28 NOTE — Telephone Encounter (Signed)
 Patient called. He would like an injection in his lower back. His cb# [phone]

## 2023-06-05 ENCOUNTER — Encounter: Payer: Self-pay | Admitting: Physical Medicine and Rehabilitation

## 2023-06-05 ENCOUNTER — Ambulatory Visit: Admitting: Physical Medicine and Rehabilitation

## 2023-06-05 DIAGNOSIS — M5442 Lumbago with sciatica, left side: Secondary | ICD-10-CM

## 2023-06-05 DIAGNOSIS — G8929 Other chronic pain: Secondary | ICD-10-CM | POA: Diagnosis not present

## 2023-06-05 DIAGNOSIS — M48061 Spinal stenosis, lumbar region without neurogenic claudication: Secondary | ICD-10-CM

## 2023-06-05 DIAGNOSIS — M5416 Radiculopathy, lumbar region: Secondary | ICD-10-CM | POA: Diagnosis not present

## 2023-06-05 MED ORDER — TRAMADOL HCL 50 MG PO TABS: 50.0000 mg | ORAL_TABLET | Freq: Three times a day (TID) | ORAL | 0 refills | Status: DC | PRN

## 2023-06-05 NOTE — Progress Notes (Unsigned)
 Pain Scale   Average Pain 8  Pain the back and left leg. Has been seeing a chiropractor for 4 wks.

## 2023-06-05 NOTE — Progress Notes (Unsigned)
 Bruce Mora - 53 y.o. male MRN [id number]  Date of birth: 1970-08-03  Office Visit Note: Visit Date: 06/05/2023 PCP: [facility] Referred by: [facility]  Subjective: Chief Complaint  Patient presents with   Left Leg - Pain   Lower Back - Pain   HPI: Bruce Mora is a 53 y.o. male who comes in today for evaluation of chronic, worsening and severe left sided lower back pain radiating to hip and anterior thigh down to knee. He is patient of Dr. Colette Davies. We have seen him in the past for more right sided radicular symptoms. His left sided symptoms started several weeks ago. His pain worsens with activity and movement. He describes pain as sharp and throbbing sensation, currently rates as 8 out of 10. Some relief of pain with physician directed home exercises, rest and use of medications. He currently attends chiropractic treatments with Dr. Zula Hitch, minimal relief of pain with adjustments. History of formal physical therapy treatments with minimal relief of pain. Lumbar MRI imaging from 2024 shows multi level spondylolysis, most pronounced at L3-L4 where there is moderate bilateral foraminal stenosis. There is right sided lateral recess at this level. Also moderate left sided foraminal stenosis at L4-L5. He underwent right L3 transforaminal epidural steroid injection in our office on 11/27/2022. He reports complete resolution of pain with this procedure. 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 Patient denies focal weakness, numbness and tingling. No recent trauma or falls.      Review of Systems  Musculoskeletal:  Positive for back pain.  Neurological:  Negative for tingling, sensory change, focal weakness and weakness.  All other systems reviewed and are negative.  Otherwise per HPI.  Assessment & Plan: Visit Diagnoses:    ICD-10-CM   1. Chronic left-sided low back pain with left-sided  sciatica  M54.42 Ambulatory referral to Physical Medicine Rehab   G89.29     2. Lumbar radiculopathy  M54.16 Ambulatory referral to Physical Medicine Rehab    3. Foraminal stenosis of lumbar region  M48.061 Ambulatory referral to Physical Medicine Rehab       Plan: Findings:  Chronic, worsening and severe left sided lower back pain radiating to hip and anterior thigh down to knee. Patient continues to have severe pain despite good conservative therapies such as formal physical therapy, chiropractic treatments, home exercise regimen, rest and use of medications. Patients clinical presentation and exam are consistent with lumbar radiculopathy, more of L4 nerve pattern. We discussed treatment plan in detail today. Next step is to perform diagnostic and hopefully therapeutic left L4 transforaminal epidural steroid injection under fluoroscopic guidance. If good relief of pain with this injection we can repeat this procedure infrequently as needed. I discussed injection procedure with him in detail today, he has no questions at this time. We discussed medication management, I prescribed short course of Tramadol  for him to take while we are waiting to get him in for injection. No red flag symptoms noted upon exam today.     Meds & Orders:  Meds ordered this encounter  Medications   traMADol  (ULTRAM ) 50 MG tablet    Sig: Take 1 tablet (50 mg total) by mouth every 8 (eight) hours as needed.    Dispense:  20 tablet    Refill:  0    Orders Placed This Encounter  Procedures   Ambulatory referral to Physical Medicine Rehab    Follow-up: Return for  Left L4 transforaminal epidural steroid injection.   Procedures: No procedures performed      Clinical History: MRI LUMBAR SPINE WITHOUT CONTRAST   TECHNIQUE: Multiplanar, multisequence MR imaging of the lumbar spine was performed. No intravenous contrast was administered.   COMPARISON:  None Available.   FINDINGS: Segmentation: Presumed standard  anatomy with the inferior-most well developed disc space designated as L5-S1.   Alignment:  Physiologic.   Vertebrae: No acute fracture. No evidence of discitis. Diffuse marrow heterogeneity without discrete marrow replacing lesion. Findings are nonspecific but can be seen in the setting of chronic anemia, smoking, and/or obesity.   Conus medullaris and cauda equina: Conus extends to the L1-2 level. Conus and cauda equina appear normal.   Paraspinal and other soft tissues: Negative.   Disc levels:   L1-L2: No significant disc protrusion, foraminal stenosis, or canal stenosis.   L2-L3: Minimal annular disc bulge. No significant canal stenosis. Mild bilateral foraminal stenosis.   L3-L4: Disc desiccation and height loss with annular disc bulge. Borderline-mild canal stenosis with moderate bilateral foraminal stenosis, right worse than left.   L4-L5: Mild annular disc bulge. No canal stenosis. Moderate left and mild right foraminal stenosis.   L5-S1: Mild annular disc bulge. No canal stenosis. Mild bilateral foraminal stenosis.   IMPRESSION: 1. Multilevel lumbar spondylosis, most pronounced at L3-L4 where there is borderline-mild canal stenosis and moderate bilateral foraminal stenosis. 2. Moderate left foraminal stenosis at L4-L5.     Electronically Signed   By: Leverne Reading D.O.   On: 10/02/2022 11:12   He reports that he has never smoked. He has never used smokeless tobacco. No results for input(s): "HGBA1C", "LABURIC" in the last 8760 hours.  Objective:  VS:  HT:    WT:   BMI:     BP:   HR: bpm  TEMP: ( )  RESP:  Physical Exam Vitals and nursing note reviewed.  HENT:     Head: Normocephalic and atraumatic.     Right Ear: External ear normal.     Left Ear: External ear normal.     Nose: Nose normal.     Mouth/Throat:     Mouth: Mucous membranes are moist.  Eyes:     Extraocular Movements: Extraocular movements intact.  Cardiovascular:     Rate and  Rhythm: Normal rate.     Pulses: Normal pulses.  Pulmonary:     Effort: Pulmonary effort is normal.  Abdominal:     General: Abdomen is flat. There is no distension.  Musculoskeletal:        General: Tenderness present.     Cervical back: Normal range of motion.     Comments: Patient rises from seated position to standing without difficulty. Good lumbar range of motion. No pain noted with facet loading. 5/5 strength noted with bilateral hip flexion, knee flexion/extension, ankle dorsiflexion/plantarflexion and EHL. No clonus noted bilaterally. No pain upon palpation of greater trochanters. No pain with internal/external rotation of bilateral hips. Sensation intact bilaterally. Dysesthesias noted to left L4 dermatome. Negative slump test bilaterally. Ambulates without aid, gait steady.     Skin:    General: Skin is warm and dry.     Capillary Refill: Capillary refill takes less than 2 seconds.  Neurological:     General: No focal deficit present.     Mental Status: He is alert and oriented to person, place, and time.  Psychiatric:        Mood and Affect: Mood normal.  Behavior: Behavior normal.     Ortho Exam  Imaging: No results found.  Past Medical/Family/Surgical/Social History: Medications & Allergies reviewed per EMR, new medications updated. Patient Active Problem List   Diagnosis Date Noted   Right sided weakness 08/22/2021   Decreased pulses in feet 04/20/2012   Dyspnea 04/20/2012   Diabetes type 2, uncontrolled 04/06/2012   Microalbuminuria 04/06/2012   Leg swelling 04/06/2012   Erectile dysfunction 04/06/2012   Type 2 diabetes mellitus with diabetic neuropathy (HCC) 02/25/2011   Essential hypertension, benign 02/25/2011   Hyperlipidemia 02/25/2011   Screening for prostate cancer 02/25/2011   Diabetic neuropathy (HCC) 02/25/2011   Tinea pedis 02/25/2011   Genital warts 02/25/2011   General medical examination 02/25/2011   Past Medical History:  Diagnosis  Date   Atelectasis    chorine gas exposure, hospitalization   Diabetes mellitus    Diabetic neuropathy (HCC)    Diabetic retinopathy    laser surgery - 2010   Dyslipidemia    ED (erectile dysfunction)    History of hepatitis B vaccination    Hypertension    Microalbuminuria    Refused pneumococcal vaccine 03/2012   Family History  Problem Relation Age of Onset   Diabetes Mother    Cancer Mother        breast   Arthritis Mother    Diabetes Father    Cancer Maternal Grandmother        died of lung cancer, hx/o breast cancer   Stroke Maternal Aunt    Hypertension Other        throughout both sides of family   Heart disease Neg Hx    Past Surgical History:  Procedure Laterality Date   CHOLECYSTECTOMY     COLONOSCOPY WITH PROPOFOL  N/A 08/27/2020   Procedure: COLONOSCOPY WITH PROPOFOL ;  Surgeon: Quintin Buckle, DO;  Location: [facility] ENDOSCOPY;  Service: Endoscopy;  Laterality: N/A;   ESOPHAGOGASTRODUODENOSCOPY (EGD) WITH PROPOFOL  N/A 08/27/2020   Procedure: ESOPHAGOGASTRODUODENOSCOPY (EGD) WITH PROPOFOL ;  Surgeon: Quintin Buckle, DO;  Location: [facility] ENDOSCOPY;  Service: Endoscopy;  Laterality: N/A;   HERNIA REPAIR     umbilical x 2   WISDOM TOOTH EXTRACTION     Social History   Occupational History   Occupation: Associate Professor: [employer]  Tobacco Use   Smoking status: Never   Smokeless tobacco: Never  Vaping Use   Vaping status: Never Used  Substance and Sexual Activity   Alcohol use: Yes    Alcohol/week: 12.0 standard drinks of alcohol    Types: 12 Cans of beer per week   Drug use: No   Sexual activity: Not on file

## 2023-06-15 ENCOUNTER — Other Ambulatory Visit: Payer: Self-pay

## 2023-06-15 ENCOUNTER — Ambulatory Visit: Admitting: Physical Medicine and Rehabilitation

## 2023-06-15 VITALS — BP 144/89 | HR 94

## 2023-06-15 DIAGNOSIS — M5416 Radiculopathy, lumbar region: Secondary | ICD-10-CM

## 2023-06-15 MED ORDER — METHYLPREDNISOLONE ACETATE 40 MG/ML IJ SUSP
40.0000 mg | Freq: Once | INTRAMUSCULAR | Status: AC
  Administered 2023-06-15: 40 mg

## 2023-06-15 NOTE — Progress Notes (Signed)
 AKITO BOOMHOWER - 53 y.o. male MRN [id number]  Date of birth: 1970/12/25  Office Visit Note: Visit Date: 06/15/2023 PCP: [facility] Referred by: Diedra Fowler, MD  Subjective: Chief Complaint  Patient presents with   Lower Back - Pain   HPI:  ZACKREY DYAR is a 53 y.o. male who comes in today at the request of Elvan Hamel, FNP for planned Left L4-5 Lumbar Transforaminal epidural steroid injection with fluoroscopic guidance.  The patient has failed conservative care including home exercise, medications, time and activity modification.  This injection will be diagnostic and hopefully therapeutic.  Please see requesting physician notes for further details and justification.   ROS Otherwise per HPI.  Assessment & Plan: Visit Diagnoses:    ICD-10-CM   1. Lumbar radiculopathy  M54.16 XR C-ARM NO REPORT    Epidural Steroid injection    methylPREDNISolone  acetate (DEPO-MEDROL ) injection 40 mg      Plan: No additional findings.   Meds & Orders:  Meds ordered this encounter  Medications   methylPREDNISolone  acetate (DEPO-MEDROL ) injection 40 mg    Orders Placed This Encounter  Procedures   XR C-ARM NO REPORT   Epidural Steroid injection    Follow-up: Return if symptoms worsen or fail to improve.   Procedures: No procedures performed  Lumbosacral Transforaminal Epidural Steroid Injection - Sub-Pedicular Approach with Fluoroscopic Guidance  Patient: KYLIL SWOPES      Date of Birth: 09/11/1970 MRN: [id number] PCP: [facility]      Visit Date: 06/15/2023   Universal Protocol:    Date/Time: 06/15/2023  Consent Given By: the patient  Position: PRONE  Additional Comments: Vital signs were monitored before and after the procedure. Patient was prepped and draped in the usual sterile fashion. The correct patient, procedure, and site was verified.   Injection Procedure Details:   Procedure diagnoses:  Lumbar radiculopathy [M54.16]    Meds Administered:  Meds ordered this encounter  Medications   methylPREDNISolone  acetate (DEPO-MEDROL ) injection 40 mg    Laterality: Left  Location/Site: L4  Needle:5.0 in., 22 ga.  Short bevel or Quincke spinal needle  Needle Placement: Transforaminal  Findings:    -Comments: Excellent flow of contrast along the nerve, nerve root and into the epidural space.  Procedure Details: After squaring off the end-plates to get a true AP view, the C-arm was positioned so that an oblique view of the foramen as noted above was visualized. The target area is just inferior to the "nose of the scotty dog" or sub pedicular. The soft tissues overlying this structure were infiltrated with 2-3 ml. of 1% Lidocaine  without Epinephrine.  The spinal needle was inserted toward the target using a "trajectory" view along the fluoroscope beam.  Under AP and lateral visualization, the needle was advanced so it did not puncture dura and was located close the 6 O'Clock position of the pedical in AP tracterory. Biplanar projections were used to confirm position. Aspiration was confirmed to be negative for CSF and/or blood. A 1-2 ml. volume of Isovue-250 was injected and flow of contrast was noted at each level. Radiographs were obtained for documentation purposes.   After attaining the desired flow of contrast documented above, a 0.5 to 1.0 ml test dose of 0.25% Marcaine was injected into each respective transforaminal space.  The patient was observed for 90 seconds post injection.  After no sensory deficits were reported, and normal lower extremity motor function was noted,  the above injectate was administered so that equal amounts of the injectate were placed at each foramen (level) into the transforaminal epidural space.   Additional Comments:  The patient tolerated the procedure well Dressing: 2 x 2 sterile gauze and Band-Aid    Post-procedure details: Patient was  observed during the procedure. Post-procedure instructions were reviewed.  Patient left the clinic in stable condition.    Clinical History: MRI LUMBAR SPINE WITHOUT CONTRAST   TECHNIQUE: Multiplanar, multisequence MR imaging of the lumbar spine was performed. No intravenous contrast was administered.   COMPARISON:  None Available.   FINDINGS: Segmentation: Presumed standard anatomy with the inferior-most well developed disc space designated as L5-S1.   Alignment:  Physiologic.   Vertebrae: No acute fracture. No evidence of discitis. Diffuse marrow heterogeneity without discrete marrow replacing lesion. Findings are nonspecific but can be seen in the setting of chronic anemia, smoking, and/or obesity.   Conus medullaris and cauda equina: Conus extends to the L1-2 level. Conus and cauda equina appear normal.   Paraspinal and other soft tissues: Negative.   Disc levels:   L1-L2: No significant disc protrusion, foraminal stenosis, or canal stenosis.   L2-L3: Minimal annular disc bulge. No significant canal stenosis. Mild bilateral foraminal stenosis.   L3-L4: Disc desiccation and height loss with annular disc bulge. Borderline-mild canal stenosis with moderate bilateral foraminal stenosis, right worse than left.   L4-L5: Mild annular disc bulge. No canal stenosis. Moderate left and mild right foraminal stenosis.   L5-S1: Mild annular disc bulge. No canal stenosis. Mild bilateral foraminal stenosis.   IMPRESSION: 1. Multilevel lumbar spondylosis, most pronounced at L3-L4 where there is borderline-mild canal stenosis and moderate bilateral foraminal stenosis. 2. Moderate left foraminal stenosis at L4-L5.     Electronically Signed   By: Leverne Reading D.O.   On: 10/02/2022 11:12     Objective:  VS:  HT:    WT:   BMI:     BP:(!) 144/89  HR:94bpm  TEMP: ( )  RESP:  Physical Exam Vitals and nursing note reviewed.  Constitutional:      General: He is  not in acute distress.    Appearance: Normal appearance. He is not ill-appearing.  HENT:     Head: Normocephalic and atraumatic.     Right Ear: External ear normal.     Left Ear: External ear normal.     Nose: No congestion.  Eyes:     Extraocular Movements: Extraocular movements intact.  Cardiovascular:     Rate and Rhythm: Normal rate.     Pulses: Normal pulses.  Pulmonary:     Effort: Pulmonary effort is normal. No respiratory distress.  Abdominal:     General: There is no distension.     Palpations: Abdomen is soft.  Musculoskeletal:        General: No tenderness or signs of injury.     Cervical back: Neck supple.     Right lower leg: No edema.     Left lower leg: No edema.     Comments: Patient has good distal strength without clonus.  Skin:    Findings: No erythema or rash.  Neurological:     General: No focal deficit present.     Mental Status: He is alert and oriented to person, place, and time.     Sensory: No sensory deficit.     Motor: No weakness or abnormal muscle tone.     Coordination: Coordination normal.  Psychiatric:  Mood and Affect: Mood normal.        Behavior: Behavior normal.      Imaging: No results found.

## 2023-06-15 NOTE — Patient Instructions (Signed)

## 2023-06-15 NOTE — Progress Notes (Signed)
 Pain Scale   Average Pain 10 Patient advising his pain radiates from lower back and to his left leg. Patient also states at times pain increase with walking and standing        +Driver, -BT, -Dye Allergies.

## 2023-06-15 NOTE — Procedures (Signed)
 Lumbosacral Transforaminal Epidural Steroid Injection - Sub-Pedicular Approach with Fluoroscopic Guidance  Patient: Bruce Mora      Date of Birth: [birth year] MRN: [id number] PCP: [facility]      Visit Date: 06/15/2023   Universal Protocol:    Date/Time: 06/15/2023  Consent Given By: the patient  Position: PRONE  Additional Comments: Vital signs were monitored before and after the procedure. Patient was prepped and draped in the usual sterile fashion. The correct patient, procedure, and site was verified.   Injection Procedure Details:   Procedure diagnoses: Lumbar radiculopathy [M54.16]    Meds Administered:  Meds ordered this encounter  Medications   methylPREDNISolone  acetate (DEPO-MEDROL ) injection 40 mg    Laterality: Left  Location/Site: L4  Needle:5.0 in., 22 ga.  Short bevel or Quincke spinal needle  Needle Placement: Transforaminal  Findings:    -Comments: Excellent flow of contrast along the nerve, nerve root and into the epidural space.  Procedure Details: After squaring off the end-plates to get a true AP view, the C-arm was positioned so that an oblique view of the foramen as noted above was visualized. The target area is just inferior to the "nose of the scotty dog" or sub pedicular. The soft tissues overlying this structure were infiltrated with 2-3 ml. of 1% Lidocaine  without Epinephrine.  The spinal needle was inserted toward the target using a "trajectory" view along the fluoroscope beam.  Under AP and lateral visualization, the needle was advanced so it did not puncture dura and was located close the 6 O'Clock position of the pedical in AP tracterory. Biplanar projections were used to confirm position. Aspiration was confirmed to be negative for CSF and/or blood. A 1-2 ml. volume of Isovue-250 was injected and flow of contrast was noted at each level. Radiographs were obtained for documentation purposes.   After  attaining the desired flow of contrast documented above, a 0.5 to 1.0 ml test dose of 0.25% Marcaine was injected into each respective transforaminal space.  The patient was observed for 90 seconds post injection.  After no sensory deficits were reported, and normal lower extremity motor function was noted,   the above injectate was administered so that equal amounts of the injectate were placed at each foramen (level) into the transforaminal epidural space.   Additional Comments:  The patient tolerated the procedure well Dressing: 2 x 2 sterile gauze and Band-Aid    Post-procedure details: Patient was observed during the procedure. Post-procedure instructions were reviewed.  Patient left the clinic in stable condition.

## 2023-06-29 ENCOUNTER — Telehealth: Payer: Self-pay | Admitting: Physical Medicine and Rehabilitation

## 2023-06-29 NOTE — Telephone Encounter (Signed)
 Pt called called requesting a call from Megan or [place]. Pt states pain camE BACK AFTER INJECTION. PT PHONE NUMBER IS [phone].

## 2023-07-01 ENCOUNTER — Ambulatory Visit: Admitting: Physical Medicine and Rehabilitation

## 2023-07-01 DIAGNOSIS — M5442 Lumbago with sciatica, left side: Secondary | ICD-10-CM | POA: Diagnosis not present

## 2023-07-01 DIAGNOSIS — G8929 Other chronic pain: Secondary | ICD-10-CM

## 2023-07-01 DIAGNOSIS — M48061 Spinal stenosis, lumbar region without neurogenic claudication: Secondary | ICD-10-CM | POA: Diagnosis not present

## 2023-07-01 DIAGNOSIS — M5416 Radiculopathy, lumbar region: Secondary | ICD-10-CM | POA: Diagnosis not present

## 2023-07-01 NOTE — Progress Notes (Signed)
 Bruce Mora - 53 y.o. male MRN [id number]  Date of birth: 11-Jun-1970  Office Visit Note: Visit Date: 07/01/2023 PCP: [facility] Referred by: [facility]  Subjective: Chief Complaint  Patient presents with   Lower Back - Follow-up   HPI: Bruce Mora is a 53 y.o. male who comes in today for evaluation of chronic, worsening and severe left sided lower back pain radiating to hip and anterior thigh down to knee. Patient is here today for follow up post injection. He underwent left L4 transforaminal epidural steroid injection in our office on 06/15/2023. He reports complete resolution of pain for almost 2 weeks. States his pain returned last week after chiropractic adjustment.  He is patient of Dr. Colette Davies. We have seen him in the past for more right sided radicular symptoms. His left sided symptoms started almost a month ago. His pain worsens with activity and movement. He describes pain as sharp and throbbing sensation, currently rates as 10 out of 10. Some relief of pain with physician directed home exercises, rest and use of medications. History of formal physical therapy treatments with minimal relief of pain. Lumbar MRI imaging from 2024 shows multi level spondylolysis, most pronounced at L3-L4 where there is moderate bilateral foraminal stenosis. There is right sided lateral recess at this level. Also moderate left sided foraminal stenosis at L4-L5. No high grade spinal canal stenosis noted. Patient denies focal weakness, numbness and tingling. No recent trauma or falls.      Review of Systems  Musculoskeletal:  Positive for back pain.  Neurological:  Negative for tingling, sensory change, focal weakness and weakness.  All other systems reviewed and are negative.  Otherwise per HPI.  Assessment & Plan: Visit Diagnoses:    ICD-10-CM   1. Chronic left-sided low back pain with left-sided sciatica  M54.42    G89.29     2. Lumbar  radiculopathy  M54.16        Plan: Findings:  Chronic, worsening and severe left sided lower back pain radiating to hip and anterior thigh down to knee. Patient continues to have severe pain despite good conservative therapies such as formal physical therapy, chiropractic treatments, home exercise regimen, rest and use of medications. Patients clinical presentation and exam are consistent with lumbar radiculopathy, more of L4 nerve pattern. He did get fairly significant relief of pain with recent left L4 transforaminal injection for nearly 2 weeks. I placed order today for diagnostic and hopefully therapeutic right L4 transforaminal epidural steroid injection. I am hopeful we can get lasting relief with this procedure. I did encourage him to continue with home exercise regimen as tolerated, if he feels chiropractic adjustments are contributing to his pain would be a good idea to pause these treatments for the time being. He has no questions at this time. Should his pain persist would recommend re-evaluation with Dr. Sulema Endo. No red flag symptoms noted upon exam today.     Meds & Orders: No orders of the defined types were placed in this encounter.  No orders of the defined types were placed in this encounter.   Follow-up: No follow-ups on file.   Procedures: No procedures performed      Clinical History: MRI LUMBAR SPINE WITHOUT CONTRAST   TECHNIQUE: Multiplanar, multisequence MR imaging of the lumbar spine was performed. No intravenous contrast was administered.   COMPARISON:  None Available.   FINDINGS: Segmentation: Presumed standard anatomy with the inferior-most well developed disc space  designated as L5-S1.   Alignment:  Physiologic.   Vertebrae: No acute fracture. No evidence of discitis. Diffuse marrow heterogeneity without discrete marrow replacing lesion. Findings are nonspecific but can be seen in the setting of chronic anemia, smoking, and/or obesity.   Conus medullaris  and cauda equina: Conus extends to the L1-2 level. Conus and cauda equina appear normal.   Paraspinal and other soft tissues: Negative.   Disc levels:   L1-L2: No significant disc protrusion, foraminal stenosis, or canal stenosis.   L2-L3: Minimal annular disc bulge. No significant canal stenosis. Mild bilateral foraminal stenosis.   L3-L4: Disc desiccation and height loss with annular disc bulge. Borderline-mild canal stenosis with moderate bilateral foraminal stenosis, right worse than left.   L4-L5: Mild annular disc bulge. No canal stenosis. Moderate left and mild right foraminal stenosis.   L5-S1: Mild annular disc bulge. No canal stenosis. Mild bilateral foraminal stenosis.   IMPRESSION: 1. Multilevel lumbar spondylosis, most pronounced at L3-L4 where there is borderline-mild canal stenosis and moderate bilateral foraminal stenosis. 2. Moderate left foraminal stenosis at L4-L5.     Electronically Signed   By: Leverne Reading D.O.   On: 10/02/2022 11:12   He reports that he has never smoked. He has never used smokeless tobacco. No results for input(s): HGBA1C, LABURIC in the last 8760 hours.  Objective:  VS:  HT:    WT:   BMI:     BP:   HR: bpm  TEMP: ( )  RESP:  Physical Exam Vitals and nursing note reviewed.  HENT:     Head: Normocephalic and atraumatic.     Right Ear: External ear normal.     Left Ear: External ear normal.     Nose: Nose normal.     Mouth/Throat:     Mouth: Mucous membranes are moist.   Eyes:     Extraocular Movements: Extraocular movements intact.    Cardiovascular:     Rate and Rhythm: Normal rate.     Pulses: Normal pulses.  Pulmonary:     Effort: Pulmonary effort is normal.  Abdominal:     General: Abdomen is flat. There is no distension.   Musculoskeletal:        General: Tenderness present.     Cervical back: Normal range of motion.     Comments: Patient rises from seated position to standing without difficulty.  Good lumbar range of motion. No pain noted with facet loading. 5/5 strength noted with bilateral hip flexion, knee flexion/extension, ankle dorsiflexion/plantarflexion and EHL. No clonus noted bilaterally. No pain upon palpation of greater trochanters. No pain with internal/external rotation of bilateral hips. Sensation intact bilaterally. Dysesthesias noted to left L4 dermatome. Negative slump test bilaterally. Ambulates without aid, gait steady.        Skin:    General: Skin is warm and dry.     Capillary Refill: Capillary refill takes less than 2 seconds.   Neurological:     General: No focal deficit present.     Mental Status: He is alert and oriented to person, place, and time.   Psychiatric:        Mood and Affect: Mood normal.        Behavior: Behavior normal.     Ortho Exam  Imaging: No results found.  Past Medical/Family/Surgical/Social History: Medications & Allergies reviewed per EMR, new medications updated. Patient Active Problem List   Diagnosis Date Noted   Right sided weakness 08/22/2021   Decreased pulses in feet 04/20/2012  Dyspnea 04/20/2012   Diabetes type 2, uncontrolled 04/06/2012   Microalbuminuria 04/06/2012   Leg swelling 04/06/2012   Erectile dysfunction 04/06/2012   Type 2 diabetes mellitus with diabetic neuropathy (HCC) 02/25/2011   Essential hypertension, benign 02/25/2011   Hyperlipidemia 02/25/2011   Screening for prostate cancer 02/25/2011   Diabetic neuropathy (HCC) 02/25/2011   Tinea pedis 02/25/2011   Genital warts 02/25/2011   General medical examination 02/25/2011   Past Medical History:  Diagnosis Date   Atelectasis    chorine gas exposure, hospitalization   Diabetes mellitus    Diabetic neuropathy (HCC)    Diabetic retinopathy    laser surgery - 2010   Dyslipidemia    ED (erectile dysfunction)    History of hepatitis B vaccination    Hypertension    Microalbuminuria    Refused pneumococcal vaccine 03/2012   Family History   Problem Relation Age of Onset   Diabetes Mother    Cancer Mother        breast   Arthritis Mother    Diabetes Father    Cancer Maternal Grandmother        died of lung cancer, hx/o breast cancer   Stroke Maternal Aunt    Hypertension Other        throughout both sides of family   Heart disease Neg Hx    Past Surgical History:  Procedure Laterality Date   CHOLECYSTECTOMY     COLONOSCOPY WITH PROPOFOL  N/A 08/27/2020   Procedure: COLONOSCOPY WITH PROPOFOL ;  Surgeon: Quintin Buckle, DO;  Location: [facility] ENDOSCOPY;  Service: Endoscopy;  Laterality: N/A;   ESOPHAGOGASTRODUODENOSCOPY (EGD) WITH PROPOFOL  N/A 08/27/2020   Procedure: ESOPHAGOGASTRODUODENOSCOPY (EGD) WITH PROPOFOL ;  Surgeon: Quintin Buckle, DO;  Location: [facility] ENDOSCOPY;  Service: Endoscopy;  Laterality: N/A;   HERNIA REPAIR     umbilical x 2   WISDOM TOOTH EXTRACTION     Social History   Occupational History   Occupation: Associate Professor: [employer]  Tobacco Use   Smoking status: Never   Smokeless tobacco: Never  Vaping Use   Vaping status: Never Used  Substance and Sexual Activity   Alcohol use: Yes    Alcohol/week: 12.0 standard drinks of alcohol    Types: 12 Cans of beer per week   Drug use: No   Sexual activity: Not on file

## 2023-07-01 NOTE — Progress Notes (Unsigned)
 Pain Scale   Average Pain 10 Patient advising he had an injection in lower back on 06/15/23 and then on 06/19/23 he went to chiropractor and that evening his pain began to worsen.          +Driver, -BT, -Dye Allergies.

## 2023-07-02 ENCOUNTER — Encounter: Payer: Self-pay | Admitting: Physical Medicine and Rehabilitation

## 2023-07-09 ENCOUNTER — Telehealth: Payer: Self-pay | Admitting: Physical Medicine and Rehabilitation

## 2023-07-09 MED ORDER — TRAMADOL HCL 50 MG PO TABS: 50.0000 mg | ORAL_TABLET | Freq: Three times a day (TID) | ORAL | 0 refills | Status: AC | PRN

## 2023-07-09 NOTE — Telephone Encounter (Signed)
 Pt request something for pain Pharmacy [facility] in [place]

## 2023-11-16 ENCOUNTER — Encounter: Payer: Self-pay | Admitting: Radiology
